# Patient Record
Sex: Female | Born: 2000 | Race: Black or African American | Hispanic: No | Marital: Single
Health system: Midwestern US, Community
[De-identification: ages and names within clinical notes are randomized; demographics above are authoritative.]

## PROBLEM LIST (undated history)

## (undated) DIAGNOSIS — N946 Dysmenorrhea, unspecified: Secondary | ICD-10-CM

## (undated) DIAGNOSIS — F331 Major depressive disorder, recurrent, moderate: Secondary | ICD-10-CM

## (undated) DIAGNOSIS — A599 Trichomoniasis, unspecified: Secondary | ICD-10-CM

## (undated) DIAGNOSIS — N760 Acute vaginitis: Principal | ICD-10-CM

## (undated) DIAGNOSIS — B379 Candidiasis, unspecified: Secondary | ICD-10-CM

## (undated) DIAGNOSIS — B9689 Other specified bacterial agents as the cause of diseases classified elsewhere: Secondary | ICD-10-CM

## (undated) DIAGNOSIS — N3000 Acute cystitis without hematuria: Secondary | ICD-10-CM

## (undated) DIAGNOSIS — E669 Obesity, unspecified: Secondary | ICD-10-CM

## (undated) DIAGNOSIS — Z789 Other specified health status: Secondary | ICD-10-CM

## (undated) HISTORY — PX: NO PAST SURGERIES: SHX2092

## (undated) HISTORY — DX: Obesity, unspecified: E66.9

---
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
## 2019-02-28 ENCOUNTER — Other Ambulatory Visit: Payer: Self-pay

## 2019-02-28 ENCOUNTER — Encounter (HOSPITAL_COMMUNITY): Payer: Self-pay

## 2019-02-28 ENCOUNTER — Ambulatory Visit (HOSPITAL_COMMUNITY)
Admission: EM | Admit: 2019-02-28 | Discharge: 2019-02-28 | Disposition: A | Discharge status: Home / Self Care | Payer: Medicaid Other

## 2019-02-28 DIAGNOSIS — K0889 Other specified disorders of teeth and supporting structures: Secondary | ICD-10-CM

## 2019-02-28 MED ORDER — AMOXICILLIN 500 MG PO CAPS: 500.0000 mg | ORAL_CAPSULE | Freq: Three times a day (TID) | ORAL | 0 refills | Status: DC

## 2019-02-28 MED ORDER — IBUPROFEN 800 MG PO TABS: 800.0000 mg | ORAL_TABLET | Freq: Three times a day (TID) | ORAL | 0 refills | Status: DC | PRN

## 2019-02-28 NOTE — Discharge Instructions (Addendum)
Take the amoxicillin and ibuprofen as prescribed.    A dental resource guide is attached.  Please follow-up with your dentist as scheduled on 03/06/2019.  Return here or go to the emergency department if you develop increased pain, swelling, fever, chills, or other concerning symptoms.

## 2019-02-28 NOTE — ED Provider Notes (Signed)
[facility]    CSN: [id number] Arrival date & time: 02/28/19  1510      History   Chief Complaint Chief Complaint  Patient presents with  . Dental Pain    HPI Heather Doyle is a 18 y.o. female.   Presents with a 4-day history of left lower tooth pain.  She has a dental appointment scheduled for 03/06/2019.  She rates the pain 8/10 at its worst.  She states the pain is worse with cold air and improves when her mouth is closed.  She denies difficulty swallowing or breathing.  She denies fever or chills.  LMP: 02/18/2019.  The history is provided by the patient.    Past Medical History:  Diagnosis Date  . Medical history non-contributory   . Obesity     Patient Active Problem List   Diagnosis Date Noted  . Morbid obesity (HCC) 11/29/2012  . Acanthosis nigricans, acquired 11/29/2012  . Pallor 11/29/2012  . Goiter 11/29/2012  . Dyspepsia 11/29/2012  . GERD (gastroesophageal reflux disease) 11/29/2012  . Essential hypertension, benign 11/29/2012  . Pure hypercholesterolemia 11/29/2012    Past Surgical History:  Procedure Laterality Date  . NO PAST SURGERIES      OB History    Gravida  0   Para  0   Term  0   Preterm  0   AB  0   Living  0     SAB  0   TAB  0   Ectopic  0   Multiple  0   Live Births  0            Home Medications    Prior to Admission medications   Medication Sig Start Date End Date Taking? Authorizing Provider  amoxicillin (AMOXIL) 500 MG capsule Take 1 capsule (500 mg total) by mouth 3 (three) times daily. 02/28/19   Mickie Bailate, Kao Berkheimer H, NP  cyclobenzaprine (FLEXERIL) 10 MG tablet Take 1 tablet by mouth before bed as needed for muscle spasm. Warning: May cause drowsiness. 10/22/18   Mardella LaymanHagler, Brian, MD  ibuprofen (ADVIL) 800 MG tablet Take 1 tablet (800 mg total) by mouth every 8 (eight) hours as needed. 02/28/19   Mickie Bailate, Neeti Knudtson H, NP  naproxen (NAPROSYN) 500 MG tablet Take 1 tablet (500 mg total) by mouth 2 (two) times  daily. 10/22/18   Mardella LaymanHagler, Brian, MD    Family History Family History  Problem Relation Age of Onset  . Hypertension Mother   . Diabetes Maternal Grandmother     Social History Social History   Tobacco Use  . Smoking status: Passive Smoke Exposure - Never Smoker  . Smokeless tobacco: Never Used  . Tobacco comment: boyfriend smokes  Substance Use Topics  . Alcohol use: No  . Drug use: No     Allergies   Patient has no known allergies.   Review of Systems Review of Systems  Constitutional: Negative for chills and fever.  HENT: Positive for dental problem. Negative for ear pain, sore throat and trouble swallowing.   Eyes: Negative for pain and visual disturbance.  Respiratory: Negative for cough and shortness of breath.   Cardiovascular: Negative for chest pain and palpitations.  Gastrointestinal: Negative for abdominal pain and vomiting.  Genitourinary: Negative for dysuria and hematuria.  Musculoskeletal: Negative for arthralgias and back pain.  Skin: Negative for color change and rash.  Neurological: Negative for seizures and syncope.  All other systems reviewed and are negative.    Physical Exam Triage Vital  Signs ED Triage Vitals  Enc Vitals Group     BP      Pulse      Resp      Temp      Temp src      SpO2      Weight      Height      Head Circumference      Peak Flow      Pain Score      Pain Loc      Pain Edu?      Excl. in [place]?    No data found.  Updated Vital Signs BP 107/72 (BP Location: Right Arm)   Pulse 82   Temp 98.4 F (36.9 C) (Oral)   Resp 16   LMP 02/18/2019 (Approximate)   SpO2 99%   Visual Acuity Right Eye Distance:   Left Eye Distance:   Bilateral Distance:    Right Eye Near:   Left Eye Near:    Bilateral Near:     Physical Exam Vitals signs and nursing note reviewed.  Constitutional:      General: She is not in acute distress.    Appearance: She is well-developed.  HENT:     Head: Normocephalic and atraumatic.      Right Ear: Tympanic membrane normal.     Left Ear: Tympanic membrane normal.     Nose: Nose normal.     Mouth/Throat:     Mouth: Mucous membranes are moist.     Dentition: Dental caries present.     Pharynx: Oropharynx is clear.      Comments: Several dental caries in various teeth.  Eyes:     Conjunctiva/sclera: Conjunctivae normal.  Neck:     Musculoskeletal: Neck supple.  Cardiovascular:     Rate and Rhythm: Normal rate and regular rhythm.     Heart sounds: No murmur.  Pulmonary:     Effort: Pulmonary effort is normal. No respiratory distress.     Breath sounds: Normal breath sounds.  Abdominal:     Palpations: Abdomen is soft.     Tenderness: There is no abdominal tenderness. There is no guarding or rebound.  Skin:    General: Skin is warm and dry.  Neurological:     Mental Status: She is alert.      UC Treatments / Results  Labs (all labs ordered are listed, but only abnormal results are displayed) Labs Reviewed - No data to display  EKG   Radiology No results found.  Procedures Procedures (including critical care time)  Medications Ordered in UC Medications - No data to display  Initial Impression / Assessment and Plan / UC Course  I have reviewed the triage vital signs and the nursing notes.  Pertinent labs & imaging results that were available during my care of the patient were reviewed by me and considered in my medical decision making (see chart for details).    Tooth ache.  Treating with amoxicillin and ibuprofen.  Instructed patient to keep her appointment with her dentist as scheduled on 03/06/2019.  Dental resource guide provided.  Instructed her to return here or go to the ED if she has increased pain, swelling, fever, chills, or other concerns.  Patient agrees to plan of care.     Final Clinical Impressions(s) / UC Diagnoses   Final diagnoses:  Toothache     Discharge Instructions     Take the amoxicillin and ibuprofen as prescribed.     A dental resource  guide is attached.  Please follow-up with your dentist as scheduled on 03/06/2019.  Return here or go to the emergency department if you develop increased pain, swelling, fever, chills, or other concerning symptoms.        ED Prescriptions    Medication Sig Dispense Auth. Provider   ibuprofen (ADVIL) 800 MG tablet Take 1 tablet (800 mg total) by mouth every 8 (eight) hours as needed. 21 tablet Mickie Bail, NP   amoxicillin (AMOXIL) 500 MG capsule Take 1 capsule (500 mg total) by mouth 3 (three) times daily. 21 capsule Mickie Bail, NP     I have reviewed the PDMP during this encounter.   Mickie Bail, NP 02/28/19 1606

## 2019-02-28 NOTE — ED Triage Notes (Signed)
Pt presents to UC w/ c/o left lower dental pain x4 days. Pt reports she has a dental appt but wanted to be seen for "some medicine" until them. Pt reports having pain when she opens her mouth.

## 2019-06-25 ENCOUNTER — Encounter: Payer: Self-pay | Admitting: Emergency Medicine

## 2019-06-25 ENCOUNTER — Other Ambulatory Visit: Payer: Self-pay

## 2019-06-25 ENCOUNTER — Ambulatory Visit
Admission: EM | Admit: 2019-06-25 | Discharge: 2019-06-25 | Disposition: A | Discharge status: Home / Self Care | Payer: Medicaid Other | Attending: Emergency Medicine | Admitting: Emergency Medicine

## 2019-06-25 ENCOUNTER — Telehealth: Payer: Self-pay | Admitting: Emergency Medicine

## 2019-06-25 DIAGNOSIS — R1011 Right upper quadrant pain: Secondary | ICD-10-CM | POA: Diagnosis not present

## 2019-06-25 LAB — CBC WITH DIFFERENTIAL/PLATELET
Basophils Absolute: 0 10*3/uL (ref 0.0–0.2)
Basos: 0 %
EOS (ABSOLUTE): 0 10*3/uL (ref 0.0–0.4)
Eos: 1 %
Hematocrit: 39 % (ref 34.0–46.6)
Hemoglobin: 12.6 g/dL (ref 11.1–15.9)
Lymphocytes Absolute: 1.8 10*3/uL (ref 0.7–3.1)
Lymphs: 42 %
MCH: 27.4 pg (ref 26.6–33.0)
MCHC: 32.3 g/dL (ref 31.5–35.7)
MCV: 85 fL (ref 79–97)
Monocytes Absolute: 0.3 10*3/uL (ref 0.1–0.9)
Monocytes: 7 %
Neutrophils Absolute: 2.2 10*3/uL (ref 1.4–7.0)
Neutrophils: 50 %
Platelets: 241 10*3/uL (ref 150–450)
RBC: 4.6 x10E6/uL (ref 3.77–5.28)
RDW: 14.2 % (ref 11.7–15.4)
WBC: 4.4 10*3/uL (ref 3.4–10.8)

## 2019-06-25 LAB — HEPATIC FUNCTION PANEL
ALT: 11 IU/L (ref 0–32)
AST: 13 IU/L (ref 0–40)
Albumin: 4.1 g/dL (ref 3.9–5.0)
Alkaline Phosphatase: 48 IU/L (ref 39–117)
Bilirubin Total: 0.2 mg/dL (ref 0.0–1.2)
Bilirubin, Direct: <0.1 mg/dL (ref 0.00–0.40)
Total Protein: 6.2 g/dL (ref 6.0–8.5)

## 2019-06-25 LAB — LIPASE: Lipase: 23 U/L (ref 14–72)

## 2019-06-25 LAB — AMYLASE: Amylase: 55 U/L (ref 31–110)

## 2019-06-25 NOTE — Discharge Instructions (Addendum)
Labs pending: will call you with results & can do work note at that time if normal. If abnormal, will need to go to ER for further evaluation. Keep symptom log & follow up with PCP in 1-2 weeks. Go to ER for worsening pain, vomiting, fever, yellow color to eyes

## 2019-06-25 NOTE — ED Triage Notes (Signed)
Pt presents to [facility] for assessment of RUQ pain since eating at a [employer] Saturday.  States the pain has been worsening.  C/o diarrhea, increased pain with palpation, feelings of being hot and diaphoretic, and 1 episode of emesis on Sunday morning.

## 2019-06-25 NOTE — ED Provider Notes (Signed)
[facility]    CSN: [id number] Arrival date & time: 06/25/19  1119      History   Chief Complaint Chief Complaint  Patient presents with  . Abdominal Pain    HPI Nioka Thorington is a 19 y.o. female with history of obesity, hypertension, GERD presenting for quadrant pain that began Saturday evening.  States that she ate at a [employer] a few hours prior: Her mother and sister did as well, though got different entres and did not have symptoms.  Patient states that she had a generalized stomachache with sharp shooting pains developing Sunday night.  Had single episode of nonbiliary, nonbloody emesis Sunday morning that was not preceded by nausea.  Patient states her RUQ pain comes and goes without known exacerbating or alleviating factors.  Has not tried anything for this, though has been drinking more water to see if it would help.  Was able to eat a ham sandwich yesterday which made her abdominal pain worse.  She denies change in bowel or bladder habit, fever, myalgias, arthralgias.  No hematochezia or melena.  Denies history of abdominal surgery.   Past Medical History:  Diagnosis Date  . Medical history non-contributory   . Obesity     Patient Active Problem List   Diagnosis Date Noted  . Morbid obesity (HCC) 11/29/2012  . Acanthosis nigricans, acquired 11/29/2012  . Pallor 11/29/2012  . Goiter 11/29/2012  . Dyspepsia 11/29/2012  . GERD (gastroesophageal reflux disease) 11/29/2012  . Essential hypertension, benign 11/29/2012  . Pure hypercholesterolemia 11/29/2012    Past Surgical History:  Procedure Laterality Date  . NO PAST SURGERIES      OB History    Gravida  0   Para  0   Term  0   Preterm  0   AB  0   Living  0     SAB  0   TAB  0   Ectopic  0   Multiple  0   Live Births  0            Home Medications    Prior to Admission medications   Medication Sig Start Date End Date Taking? Authorizing Provider  ibuprofen  (ADVIL) 800 MG tablet Take 1 tablet (800 mg total) by mouth every 8 (eight) hours as needed. 02/28/19   Mickie Bail, NP    Family History Family History  Problem Relation Age of Onset  . Hypertension Mother   . Diabetes Maternal Grandmother     Social History Social History   Tobacco Use  . Smoking status: Passive Smoke Exposure - Never Smoker  . Smokeless tobacco: Never Used  . Tobacco comment: boyfriend smokes  Substance Use Topics  . Alcohol use: No  . Drug use: No     Allergies   Patient has no known allergies.   Review of Systems As per HPI   Physical Exam Triage Vital Signs ED Triage Vitals  Enc Vitals Group     BP      Pulse      Resp      Temp      Temp src      SpO2      Weight      Height      Head Circumference      Peak Flow      Pain Score      Pain Loc      Pain Edu?      Excl. in  [place]?    No data found.  Updated Vital Signs BP 135/81 (BP Location: Left Arm)   Pulse 84   Temp 97.8 F (36.6 C) (Temporal)   Resp 18   LMP 06/06/2019   SpO2 97%   Visual Acuity Right Eye Distance:   Left Eye Distance:   Bilateral Distance:    Right Eye Near:   Left Eye Near:    Bilateral Near:     Physical Exam Constitutional:      General: She is not in acute distress. HENT:     Head: Normocephalic and atraumatic.  Eyes:     General: No scleral icterus.    Pupils: Pupils are equal, round, and reactive to light.  Cardiovascular:     Rate and Rhythm: Normal rate.  Pulmonary:     Effort: Pulmonary effort is normal. No respiratory distress.     Breath sounds: No wheezing.  Abdominal:     General: Bowel sounds are normal.     Tenderness: There is abdominal tenderness in the right upper quadrant. There is no right CVA tenderness, left CVA tenderness, guarding or rebound. Positive signs include Murphy's sign. Negative signs include Rovsing's sign and McBurney's sign.  Skin:    Coloration: Skin is not jaundiced or pale.  Neurological:      Mental Status: She is alert and oriented to person, place, and time.      UC Treatments / Results  Labs (all labs ordered are listed, but only abnormal results are displayed) Labs Reviewed  CBC WITH DIFFERENTIAL/PLATELET   Narrative:    Performed at:  [facility] - [facility] [address]  [address], [place], [place]  [id number] Lab Director: Mila Homer MD, Phone:  [phone]  HEPATIC FUNCTION PANEL   Narrative:    Performed at:  [facility] [address]  [address], [place], [place]  [id number] Lab Director: Mila Homer MD, Phone:  [phone]  AMYLASE   Narrative:    Performed at:  [facility] - [facility] [address]  [address], [place], [place]  [id number] Lab Director: Mila Homer MD, Phone:  [phone]  LIPASE   Narrative:    Performed at:  27 - [facility] [address]  [address], [place], [place]  [id number] Lab Director: Mila Homer MD, Phone:  [phone]    EKG   Radiology No results found.  Procedures Procedures (including critical care time)  Medications Ordered in UC Medications - No data to display  Initial Impression / Assessment and Plan / UC Course  I have reviewed the triage vital signs and the nursing notes.  Pertinent labs & imaging results that were available during my care of the patient were reviewed by me and considered in my medical decision making (see chart for details).     Patient afebrile, nontoxic in office today.  Patient does have significant RUQ tenderness to palpation on exam with positive Murphy sign.  Patient does have her gallbladder: Denies history of gallstones or liver issues.  No history of pancreatitis, heavy alcohol use or hypertriglyceridemia.  Will get amylase, lipase, hepatic function panel, CBC to to help rule in/out acute cholecystitis, otitis.  Patient requesting work note: We will defer until lab results are back-if normal, will allow patient to return to  work tomorrow she is afebrile and having improvement in symptoms.  If significantly elevated WBC, alkaline phosphatase, ALT/AST, or amylase/lipase would recommend patient go to ER for further evaluation  given pain on exam.  ER return precautions discussed, patient verbalized understanding and is agreeable to plan. Final Clinical Impressions(s) / UC Diagnoses   Final diagnoses:  RUQ pain     Discharge Instructions     Labs pending: will call you with results & can do work note at that time if normal. If abnormal, will need to go to ER for further evaluation. Keep symptom log & follow up with PCP in 1-2 weeks. Go to ER for worsening pain, vomiting, fever, yellow color to eyes    ED Prescriptions    None     PDMP not reviewed this encounter.   Hall-Potvin, [place], [place] 06/25/19 1542

## 2019-06-25 NOTE — Telephone Encounter (Signed)
Called patient to inform her of no abnormal lab results, per aPP request.  Also updated patient on work note, which I will print and leave at front desk for her.  Discussed pain management, including Ibuprofen or Naproxen, and heat.  Patient verbalized understanding.

## 2019-07-27 ENCOUNTER — Encounter: Payer: Self-pay | Admitting: Emergency Medicine

## 2019-07-27 ENCOUNTER — Ambulatory Visit
Admission: EM | Admit: 2019-07-27 | Discharge: 2019-07-27 | Disposition: A | Discharge status: Home / Self Care | Payer: Medicaid Other | Attending: Emergency Medicine | Admitting: Emergency Medicine

## 2019-07-27 ENCOUNTER — Other Ambulatory Visit: Payer: Self-pay

## 2019-07-27 DIAGNOSIS — G8929 Other chronic pain: Secondary | ICD-10-CM

## 2019-07-27 DIAGNOSIS — R1011 Right upper quadrant pain: Secondary | ICD-10-CM

## 2019-07-27 DIAGNOSIS — M546 Pain in thoracic spine: Secondary | ICD-10-CM | POA: Diagnosis not present

## 2019-07-27 MED ORDER — MELOXICAM 7.5 MG PO TABS: 7.5000 mg | ORAL_TABLET | Freq: Every day | ORAL | 0 refills | Status: DC

## 2019-07-27 NOTE — ED Triage Notes (Addendum)
Pt presents to [facility] for a reoccurence of the same abdominal pain starting two days ago, RUQ.  Also c/o back pain when she moves certain ways, like a pulling sensation from the RUQ pain location.  Denies vomiting.  C/o looser than normal stools.  Pt states she cannot afford to follow up with specialist recommended at her last visit.

## 2019-07-27 NOTE — ED Provider Notes (Signed)
[facility]    CSN: [id number] Arrival date & time: 07/27/19  1342      History   Chief Complaint Chief Complaint  Patient presents with  . Abdominal Pain    HPI Heather Doyle is a 19 y.o. female with history of obesity presenting for recurrent RUQ pain.  Patient previously evaluated by me for this on 06/25/2019: Unremarkable work-up including CBC, CMP, amylase and lipase.  Patient endorsing resolution of symptoms a few days after initial UC visit.  States this time has been ongoing for 3 days.  Feels it is worse with certain movements and radiates around towards her back.  Denies rash, fever, chills, other areas of pain.  No nausea, vomiting.  Patient tried Aleve without significant relief of pain.  Patient states that she does have to stand at work a lot and does perform a lot of bending motions.  No upper or lower extremity numbness, weakness, change in bowel or bladder habit.   Past Medical History:  Diagnosis Date  . Medical history non-contributory   . Obesity     Patient Active Problem List   Diagnosis Date Noted  . Morbid obesity ([place]) 11/29/2012  . Acanthosis nigricans, acquired 11/29/2012  . Pallor 11/29/2012  . Goiter 11/29/2012  . Dyspepsia 11/29/2012  . GERD (gastroesophageal reflux disease) 11/29/2012  . Essential hypertension, benign 11/29/2012  . Pure hypercholesterolemia 11/29/2012    Past Surgical History:  Procedure Laterality Date  . NO PAST SURGERIES      OB History    Gravida  0   Para  0   Term  0   Preterm  0   AB  0   Living  0     SAB  0   TAB  0   Ectopic  0   Multiple  0   Live Births  0            Home Medications    Prior to Admission medications   Medication Sig Start Date End Date Taking? Authorizing Provider  meloxicam (MOBIC) 7.5 MG tablet Take 1 tablet (7.5 mg total) by mouth daily. 07/27/19   Hall-Potvin, [place], PA-C    Family History Family History  Problem Relation Age of Onset  .  Hypertension Mother   . Diabetes Maternal Grandmother     Social History Social History   Tobacco Use  . Smoking status: Passive Smoke Exposure - Never Smoker  . Smokeless tobacco: Never Used  . Tobacco comment: boyfriend smokes  Substance Use Topics  . Alcohol use: No  . Drug use: No     Allergies   Patient has no known allergies.   Review of Systems As per HPI   Physical Exam Triage Vital Signs ED Triage Vitals  Enc Vitals Group     BP 07/27/19 1348 126/84     Pulse Rate 07/27/19 1348 83     Resp 07/27/19 1348 18     Temp 07/27/19 1348 (!) 97.2 F (36.2 C)     Temp Source 07/27/19 1348 Temporal     SpO2 07/27/19 1348 96 %     Weight --      Height --      Head Circumference --      Peak Flow --      Pain Score 07/27/19 1350 6     Pain Loc --      Pain Edu? --      Excl. in [place]? --    No  data found.  Updated Vital Signs BP 126/84 (BP Location: Left Arm)   Pulse 83   Temp (!) 97.2 F (36.2 C) (Temporal)   Resp 18   SpO2 96%   Visual Acuity Right Eye Distance:   Left Eye Distance:   Bilateral Distance:    Right Eye Near:   Left Eye Near:    Bilateral Near:     Physical Exam Constitutional:      General: She is not in acute distress.    Appearance: She is well-developed. She is obese. She is not ill-appearing.  HENT:     Head: Normocephalic and atraumatic.  Eyes:     General: No scleral icterus.    Pupils: Pupils are equal, round, and reactive to light.  Cardiovascular:     Rate and Rhythm: Normal rate and regular rhythm.     Heart sounds: No murmur. No gallop.   Pulmonary:     Effort: Pulmonary effort is normal. No respiratory distress.     Breath sounds: No wheezing.  Abdominal:     General: Bowel sounds are normal. There is no distension or abdominal bruit.     Palpations: Abdomen is soft.     Tenderness: There is abdominal tenderness in the right upper quadrant. There is no right CVA tenderness, left CVA tenderness, guarding or  rebound. Negative signs include Murphy's sign.  Musculoskeletal:     Comments: Mild right lateral thoracic back pain without spasm, edema, crepitus.  No rash, bruising, redness, or warmth.  No spinous process or paraspinal tenderness.  Skin:    Capillary Refill: Capillary refill takes less than 2 seconds.     Coloration: Skin is not jaundiced or pale.     Findings: No rash.  Neurological:     General: No focal deficit present.     Mental Status: She is alert and oriented to person, place, and time.      UC Treatments / Results  Labs (all labs ordered are listed, but only abnormal results are displayed) Labs Reviewed - No data to display  EKG   Radiology No results found.  Procedures Procedures (including critical care time)  Medications Ordered in UC Medications - No data to display  Initial Impression / Assessment and Plan / UC Course  I have reviewed the triage vital signs and the nursing notes.  Pertinent labs & imaging results that were available during my care of the patient were reviewed by me and considered in my medical decision making (see chart for details).     Patient appears well in office today.  Reassuring physical exam with previous unremarkable work-up for RUQ pain.  Stressed importance of following up with PCP for further evaluation/management as needed.  Will treat as musculoskeletal as outlined below.  Work note provided at patient's request.  Return precautions discussed, patient verbalized understanding and is agreeable to plan. Final Clinical Impressions(s) / UC Diagnoses   Final diagnoses:  Chronic RUQ pain  Acute right-sided thoracic back pain     Discharge Instructions     Heat therapy (hot compress, warm wash red, hot showers, etc.) can help relax muscles and soothe muscle aches. Cold therapy (ice packs) can be used to help swelling both after injury and after prolonged use of areas of chronic pain/aches.  For pain: take mobic as  directed.  May add tylenol only if needed.    ED Prescriptions    Medication Sig Dispense Auth. Provider   meloxicam (MOBIC) 7.5 MG tablet Take 1  tablet (7.5 mg total) by mouth daily. 14 tablet Hall-Potvin, [place], PA-C     I have reviewed the PDMP during this encounter.   Hall-Potvin, [place], [place] 07/27/19 1512

## 2019-07-27 NOTE — Discharge Instructions (Addendum)
Heat therapy (hot compress, warm wash red, hot showers, etc.) can help relax muscles and soothe muscle aches. Cold therapy (ice packs) can be used to help swelling both after injury and after prolonged use of areas of chronic pain/aches.  For pain: take mobic as directed.  May add tylenol only if needed.

## 2019-09-10 ENCOUNTER — Ambulatory Visit
Admission: EM | Admit: 2019-09-10 | Discharge: 2019-09-10 | Disposition: A | Discharge status: Home / Self Care | Payer: Medicaid Other | Attending: Physician Assistant | Admitting: Physician Assistant

## 2019-09-10 ENCOUNTER — Other Ambulatory Visit: Payer: Self-pay

## 2019-09-10 ENCOUNTER — Encounter: Payer: Self-pay | Admitting: Emergency Medicine

## 2019-09-10 DIAGNOSIS — R102 Pelvic and perineal pain: Secondary | ICD-10-CM

## 2019-09-10 DIAGNOSIS — M546 Pain in thoracic spine: Secondary | ICD-10-CM

## 2019-09-10 DIAGNOSIS — K5900 Constipation, unspecified: Secondary | ICD-10-CM

## 2019-09-10 LAB — POCT URINE PREGNANCY: Preg Test, Ur: NEGATIVE

## 2019-09-10 MED ORDER — METHOCARBAMOL 500 MG PO TABS: 500.0000 mg | ORAL_TABLET | Freq: Two times a day (BID) | ORAL | 0 refills | Status: DC

## 2019-09-10 MED ORDER — POLYETHYLENE GLYCOL 3350 17 G PO PACK: 17.0000 g | PACK | Freq: Every day | ORAL | 0 refills | Status: DC

## 2019-09-10 NOTE — ED Triage Notes (Signed)
Pt here with lower abd pain x 3 days; pt requesting pregnancy test; pt sts LMP was end of March; pt denies wanting testing for STD

## 2019-09-10 NOTE — Discharge Instructions (Signed)
Urine pregnancy negative. Start miralax as directed. Keep hydrated, urine should be clear to pale yellow in color. Follow up with PCP/GYN for further evaluation needed. If significant worsening of abdominal pain, nausea/vomiting, fever, go to the ED for further evaluation.  Can take ibuprofen/tylenol for back pain. Robaxin as needed, this can make you drowsy, so do not take if you are going to drive, operate heavy machinery, or make important decisions. Ice/heat compresses as needed.

## 2019-09-10 NOTE — ED Provider Notes (Addendum)
[facility]    CSN: [id number] Arrival date & time: 09/10/19  1421      History   Chief Complaint Chief Complaint  Patient presents with  . Abdominal Pain    HPI Heather Doyle is a 19 y.o. female.   19 year old female comes soon for 3-day history of low abdominal pain.  States pain is constant, waxing and waning in intensity.  She feels that smoking black in [place] makes it worse.  Denies worsening with oral intake.  She has had morning nausea without vomiting.  Has had constipation with hard stools.  Denies fever, chills, body aches.  Denies urinary for symptoms such as frequency, dysuria, hematuria.  Noticed one episode of brown discharge yesterday, has not noticed further episodes.  Denies vaginal itching.  LMP 07/27/2019.  Sexually active with 1 female partner, no consistent condom use, no birth control use. No worries for STDs.      Past Medical History:  Diagnosis Date  . Medical history non-contributory   . Obesity     Patient Active Problem List   Diagnosis Date Noted  . Morbid obesity ([place]) 11/29/2012  . Acanthosis nigricans, acquired 11/29/2012  . Pallor 11/29/2012  . Goiter 11/29/2012  . Dyspepsia 11/29/2012  . GERD (gastroesophageal reflux disease) 11/29/2012  . Essential hypertension, benign 11/29/2012  . Pure hypercholesterolemia 11/29/2012    Past Surgical History:  Procedure Laterality Date  . NO PAST SURGERIES      OB History    Gravida  0   Para  0   Term  0   Preterm  0   AB  0   Living  0     SAB  0   TAB  0   Ectopic  0   Multiple  0   Live Births  0            Home Medications    Prior to Admission medications   Medication Sig Start Date End Date Taking? Authorizing Provider  meloxicam (MOBIC) 7.5 MG tablet Take 1 tablet (7.5 mg total) by mouth daily. Patient not taking: Reported on 09/10/2019 07/27/19   Hall-Potvin, [place], PA-C  methocarbamol (ROBAXIN) 500 MG tablet Take 1 tablet (500 mg total) by  mouth 2 (two) times daily. 09/10/19   Tasia Catchings, Omauri Boeve V, PA-C  polyethylene glycol (MIRALAX) 17 g packet Take 17 g by mouth daily. 09/10/19   Ok Edwards, PA-C    Family History Family History  Problem Relation Age of Onset  . Hypertension Mother   . Diabetes Maternal Grandmother     Social History Social History   Tobacco Use  . Smoking status: Passive Smoke Exposure - Never Smoker  . Smokeless tobacco: Never Used  . Tobacco comment: boyfriend smokes  Substance Use Topics  . Alcohol use: No  . Drug use: No     Allergies   Patient has no known allergies.   Review of Systems Review of Systems  Reason unable to perform ROS: See HPI as above.     Physical Exam Triage Vital Signs ED Triage Vitals [09/10/19 1429]  Enc Vitals Group     BP 129/81     Pulse Rate 78     Resp 18     Temp 98.9 F (37.2 C)     Temp Source Oral     SpO2 98 %     Weight      Height      Head Circumference  Peak Flow      Pain Score 5     Pain Loc      Pain Edu?      Excl. in [place]?    No data found.  Updated Vital Signs BP 129/81 (BP Location: Left Arm)   Pulse 78   Temp 98.9 F (37.2 C) (Oral)   Resp 18   SpO2 98%   Physical Exam Constitutional:      General: She is not in acute distress.    Appearance: She is well-developed. She is not ill-appearing, toxic-appearing or diaphoretic.  HENT:     Head: Normocephalic and atraumatic.  Eyes:     Conjunctiva/sclera: Conjunctivae normal.     Pupils: Pupils are equal, round, and reactive to light.  Cardiovascular:     Rate and Rhythm: Normal rate and regular rhythm.  Pulmonary:     Effort: Pulmonary effort is normal. No respiratory distress.     Comments: LCTAB Abdominal:     General: Bowel sounds are normal.     Palpations: Abdomen is soft.     Tenderness: There is abdominal tenderness in the suprapubic area and left lower quadrant. There is no right CVA tenderness, left CVA tenderness, guarding or rebound.  Genitourinary:     Comments: Patient defer Musculoskeletal:     Cervical back: Normal range of motion and neck supple.     Comments: Tenderness to palpation of right thoracic back  Skin:    General: Skin is warm and dry.  Neurological:     Mental Status: She is alert and oriented to person, place, and time.  Psychiatric:        Behavior: Behavior normal.        Judgment: Judgment normal.      UC Treatments / Results  Labs (all labs ordered are listed, but only abnormal results are displayed) Labs Reviewed  POCT URINE PREGNANCY - Normal    EKG   Radiology No results found.  Procedures Procedures (including critical care time)  Medications Ordered in UC Medications - No data to display  Initial Impression / Assessment and Plan / UC Course  I have reviewed the triage vital signs and the nursing notes.  Pertinent labs & imaging results that were available during my care of the patient were reviewed by me and considered in my medical decision making (see chart for details).    Abdomen with suprapubic and left lower quadrant pain that is mild, no guarding or rebound.  Urine pregnancy negative.  No nausea or vomiting.  Currently with constipation.  Will start MiraLAX for constipation.  Push fluids.  Right-sided back pain on exam, will provide Robaxin, patient can take ibuprofen/Tylenol for pain as well.  Continue to monitor symptoms, return precautions given.  Otherwise, patient referred to GYN for further evaluation if continues without cycles.   Final Clinical Impressions(s) / UC Diagnoses   Final diagnoses:  Suprapubic abdominal pain  Constipation, unspecified constipation type  Acute right-sided thoracic back pain   ED Prescriptions    Medication Sig Dispense Auth. Provider   polyethylene glycol (MIRALAX) 17 g packet Take 17 g by mouth daily. 14 each Zinnia Tindall V, PA-C   methocarbamol (ROBAXIN) 500 MG tablet Take 1 tablet (500 mg total) by mouth 2 (two) times daily. 20 tablet Belinda Fisher,  PA-C     PDMP not reviewed this encounter.   Belinda Fisher, PA-C 09/10/19 1510    Linward Headland V, PA-C 09/10/19 1510

## 2019-09-28 ENCOUNTER — Ambulatory Visit (HOSPITAL_COMMUNITY)
Admission: EM | Admit: 2019-09-28 | Discharge: 2019-09-28 | Disposition: A | Discharge status: Home / Self Care | Payer: Medicaid Other | Attending: Family Medicine | Admitting: Family Medicine

## 2019-09-28 ENCOUNTER — Encounter (HOSPITAL_COMMUNITY): Payer: Self-pay

## 2019-09-28 ENCOUNTER — Ambulatory Visit (INDEPENDENT_AMBULATORY_CARE_PROVIDER_SITE_OTHER): Payer: Medicaid Other

## 2019-09-28 ENCOUNTER — Other Ambulatory Visit (HOSPITAL_COMMUNITY): Payer: Self-pay | Admitting: Internal Medicine

## 2019-09-28 ENCOUNTER — Telehealth (HOSPITAL_COMMUNITY): Payer: Self-pay | Admitting: *Deleted

## 2019-09-28 DIAGNOSIS — M545 Low back pain, unspecified: Secondary | ICD-10-CM

## 2019-09-28 DIAGNOSIS — R1031 Right lower quadrant pain: Secondary | ICD-10-CM

## 2019-09-28 DIAGNOSIS — R102 Pelvic and perineal pain: Secondary | ICD-10-CM | POA: Diagnosis not present

## 2019-09-28 LAB — POCT URINALYSIS DIP (DEVICE)
Bilirubin Urine: NEGATIVE
Glucose, UA: NEGATIVE mg/dL
Hgb urine dipstick: NEGATIVE
Ketones, ur: NEGATIVE mg/dL
Nitrite: NEGATIVE
Protein, ur: NEGATIVE mg/dL
Specific Gravity, Urine: 1.025 (ref 1.005–1.030)
Urobilinogen, UA: 0.2 mg/dL (ref 0.0–1.0)
pH: 7 (ref 5.0–8.0)

## 2019-09-28 LAB — POC URINE PREG, ED: Preg Test, Ur: NEGATIVE

## 2019-09-28 MED ORDER — EYE WASH OPHTH SOLN
OPHTHALMIC | Status: AC
  Filled 2019-09-28: qty 118

## 2019-09-28 MED ORDER — FLUORESCEIN SODIUM 1 MG OP STRP
ORAL_STRIP | OPHTHALMIC | Status: AC
  Filled 2019-09-28: qty 5

## 2019-09-28 MED ORDER — TETRACAINE HCL 0.5 % OP SOLN
OPHTHALMIC | Status: AC
  Filled 2019-09-28: qty 4

## 2019-09-28 NOTE — Progress Notes (Signed)
Pt left  Before I could tell her to go to the hospital for her labs. I tried to call her but her Vm is full.

## 2019-09-28 NOTE — ED Triage Notes (Signed)
Pt present lower back and abdominal pain. Symptoms started two weeks ago. Pt states that she went to another urgent care and they informed her that she was constipated but since that visit she had several bowel movements.

## 2019-09-28 NOTE — ED Provider Notes (Addendum)
[facility]    CSN: [id number] Arrival date & time: 09/28/19  1605      History   Chief Complaint Chief Complaint  Patient presents with  . Abdominal Pain  . Back Pain    HPI Heather Doyle is a 19 y.o. female. who presents with unresolved suprapubic pain LMP 5/4 but was a few days late, the prior one 4/5 Had neg pregnancy test and STD test 2 weeks when she went to urgent care. Abdominal pain provoked with standing, wearing tight clothes, after done eating, when she bares down to have a BM.     Past Medical History:  Diagnosis Date  . Medical history non-contributory   . Obesity     Patient Active Problem List   Diagnosis Date Noted  . Morbid obesity ([facility]) 11/29/2012  . Acanthosis nigricans, acquired 11/29/2012  . Pallor 11/29/2012  . Goiter 11/29/2012  . Dyspepsia 11/29/2012  . GERD (gastroesophageal reflux disease) 11/29/2012  . Essential hypertension, benign 11/29/2012  . Pure hypercholesterolemia 11/29/2012    Past Surgical History:  Procedure Laterality Date  . NO PAST SURGERIES      OB History    Gravida  0   Para  0   Term  0   Preterm  0   AB  0   Living  0     SAB  0   TAB  0   Ectopic  0   Multiple  0   Live Births  0            Home Medications    Prior to Admission medications   Medication Sig Start Date End Date Taking? Authorizing Provider  methocarbamol (ROBAXIN) 500 MG tablet Take 1 tablet (500 mg total) by mouth 2 (two) times daily. 09/10/19   Cathie Hoops, Amy V, PA-C  polyethylene glycol (MIRALAX) 17 g packet Take 17 g by mouth daily. 09/10/19   Belinda Fisher, PA-C    Family History Family History  Problem Relation Age of Onset  . Hypertension Mother   . Diabetes Maternal Grandmother     Social History Social History   Tobacco Use  . Smoking status: Passive Smoke Exposure - Never Smoker  . Smokeless tobacco: Never Used  . Tobacco comment: boyfriend smokes  Substance Use Topics  . Alcohol use: No  .  Drug use: No     Allergies   Patient has no known allergies.   Review of Systems Review of Systems  Constitutional: Positive for appetite change. Negative for chills, diaphoresis, fatigue and fever.  HENT: Negative for congestion.   Respiratory: Negative for shortness of breath.   Cardiovascular: Negative for chest pain and leg swelling.  Gastrointestinal: Positive for abdominal pain, constipation and nausea. Negative for abdominal distention, anal bleeding, blood in stool, diarrhea, rectal pain and vomiting.  Genitourinary: Positive for pelvic pain. Negative for difficulty urinating, dysuria, enuresis, frequency, hematuria, urgency, vaginal bleeding, vaginal discharge and vaginal pain.  Musculoskeletal: Negative for gait problem and myalgias.  Skin: Negative for rash.  Hematological: Negative for adenopathy.   Physical Exam Triage Vital Signs ED Triage Vitals  Enc Vitals Group     BP 09/28/19 1713 111/75     Pulse Rate 09/28/19 1713 75     Resp 09/28/19 1713 18     Temp 09/28/19 1713 98.4 F (36.9 C)     Temp Source 09/28/19 1713 Oral     SpO2 09/28/19 1713 100 %     Weight --  Height --      Head Circumference --      Peak Flow --      Pain Score 09/28/19 1715 9     Pain Loc --      Pain Edu? --      Excl. in [place]? --    No data found.  Updated Vital Signs BP 111/75 (BP Location: Left Arm)   Pulse 75   Temp 98.4 F (36.9 C) (Oral)   Resp 18   LMP 09/15/2019   SpO2 100%   Visual Acuity Right Eye Distance:   Left Eye Distance:   Bilateral Distance:    Right Eye Near:   Left Eye Near:    Bilateral Near:     Physical Exam Vitals and nursing note reviewed.  Constitutional:      General: She is not in acute distress.    Appearance: She is obese. She is not toxic-appearing.  HENT:     Head: Atraumatic.  Eyes:     General: No scleral icterus.    Extraocular Movements: Extraocular movements intact.  Pulmonary:     Effort: Pulmonary effort is normal.    Abdominal:     General: Abdomen is protuberant. Bowel sounds are normal.     Palpations: Abdomen is soft. There is no hepatomegaly, splenomegaly, mass or pulsatile mass.     Tenderness: There is abdominal tenderness in the right upper quadrant and suprapubic area. There is no right CVA tenderness, left CVA tenderness, guarding or rebound. Negative signs include Murphy's sign, psoas sign and obturator sign.     Hernia: No hernia is present.  Genitourinary:    Uterus: Normal.      Comments: Bimanual exam done only and shows no tenderness or provocation of abdominal pain with uterine motion. Due to obesity, it was difficult to assess ovarian fullness  Skin:    General: Skin is warm and dry.  Neurological:     Mental Status: She is alert and oriented to person, place, and time.  Psychiatric:        Mood and Affect: Mood normal.        Behavior: Behavior normal.   BACK- no tenderness with palpation of her back or ROM UC Treatments / Results  Labs (all labs ordered are listed, but only abnormal results are displayed) Labs Reviewed  POCT URINALYSIS DIP (DEVICE) - Abnormal; Notable for the following components:      Result Value   Leukocytes,Ua TRACE (*)    All other components within normal limits  CBC WITH DIFFERENTIAL/PLATELET  HIV ANTIBODY (ROUTINE TESTING W REFLEX)  POC URINE PREG, ED  CERVICOVAGINAL ANCILLARY ONLY  CBC abd HIV was cancelled   EKG   Radiology DG Abd 1 View  Result Date: 09/28/2019 CLINICAL DATA:  Abdominal pain and right lower quadrant pain for 2 weeks EXAM: ABDOMEN - 1 VIEW COMPARISON:  None. FINDINGS: Scattered large and small bowel gas is noted. No abnormal mass or abnormal calcifications are seen. No bony abnormality is noted. IMPRESSION: No acute abnormality seen. Electronically Signed   By: Inez Catalina M.D.   On: 09/28/2019 18:34    Procedures Procedures (including critical care time)  Medications Ordered in UC Medications - No data to  display  Initial Impression / Assessment and Plan / UC Course  I have reviewed the triage vital signs and the nursing notes. CBC with diff ordered but we could not obtain any blood after 3 times of trying. She wants to come tomorrow.  Pertinent labs & imaging results that were available during my care of the patient were reviewed by me and considered in my medical decision making (see chart for details). See instructions. Was also told she may take Ibuprofen for pain if she needs to.  Final Clinical Impressions(s) / UC Diagnoses   Final diagnoses:  Right lower quadrant abdominal pain  Suprapubic pain, acute  Low back pain, unspecified back pain laterality, unspecified chronicity, unspecified whether sciatica present     Discharge Instructions     Go to ER if your pain gets worse Try Colace 100 mg twice a day for 5 days to help empty out your colon     ED Prescriptions    None     PDMP not reviewed this encounter.   Garey Ham, PA-C 09/28/19 1904    Garey Ham, PA-C 09/28/19 1911

## 2019-09-28 NOTE — ED Notes (Signed)
Unable to obtain blood from pt per multiple clinical staff members. Provider notified.  Plan to have lab order put in system so pt can go to main hospital lab and have blood drawn 09/29/19.  Pt verbalized understanding.

## 2019-09-28 NOTE — Discharge Instructions (Addendum)
Go to ER if your pain gets worse Try Colace 100 mg twice a day for 5 days to help empty out your colon

## 2019-09-29 ENCOUNTER — Emergency Department (HOSPITAL_COMMUNITY)
Admission: EM | Admit: 2019-09-29 | Discharge: 2019-09-29 | Disposition: A | Discharge status: Home / Self Care | Payer: Medicaid Other | Attending: Emergency Medicine | Admitting: Emergency Medicine

## 2019-09-29 ENCOUNTER — Other Ambulatory Visit: Payer: Self-pay

## 2019-09-29 ENCOUNTER — Emergency Department (HOSPITAL_COMMUNITY): Payer: Medicaid Other

## 2019-09-29 DIAGNOSIS — I10 Essential (primary) hypertension: Secondary | ICD-10-CM | POA: Insufficient documentation

## 2019-09-29 DIAGNOSIS — R1011 Right upper quadrant pain: Secondary | ICD-10-CM | POA: Diagnosis not present

## 2019-09-29 DIAGNOSIS — Z7722 Contact with and (suspected) exposure to environmental tobacco smoke (acute) (chronic): Secondary | ICD-10-CM | POA: Diagnosis not present

## 2019-09-29 DIAGNOSIS — R1031 Right lower quadrant pain: Secondary | ICD-10-CM | POA: Diagnosis not present

## 2019-09-29 DIAGNOSIS — R1030 Lower abdominal pain, unspecified: Secondary | ICD-10-CM | POA: Insufficient documentation

## 2019-09-29 LAB — COMPREHENSIVE METABOLIC PANEL
ALT: 15 U/L (ref 0–44)
AST: 17 U/L (ref 15–41)
Albumin: 3.9 g/dL (ref 3.5–5.0)
Alkaline Phosphatase: 48 U/L (ref 38–126)
Anion gap: 12 (ref 5–15)
BUN: 11 mg/dL (ref 6–20)
CO2: 23 mmol/L (ref 22–32)
Calcium: 9.4 mg/dL (ref 8.9–10.3)
Chloride: 103 mmol/L (ref 98–111)
Creatinine, Ser: 0.72 mg/dL (ref 0.44–1.00)
GFR calc Af Amer: >60 mL/min (ref >60)
GFR calc non Af Amer: >60 mL/min (ref >60)
Glucose, Bld: 79 mg/dL (ref 70–99)
Potassium: 3.9 mmol/L (ref 3.5–5.1)
Sodium: 138 mmol/L (ref 135–145)
Total Bilirubin: 0.7 mg/dL (ref 0.3–1.2)
Total Protein: 7.3 g/dL (ref 6.5–8.1)

## 2019-09-29 LAB — I-STAT BETA HCG BLOOD, ED (MC, WL, AP ONLY): I-stat hCG, quantitative: <5 m[IU]/mL (ref <5)

## 2019-09-29 LAB — CBC
HCT: 43.3 % (ref 36.0–46.0)
Hemoglobin: 13.4 g/dL (ref 12.0–15.0)
MCH: 27.2 pg (ref 26.0–34.0)
MCHC: 30.9 g/dL (ref 30.0–36.0)
MCV: 88 fL (ref 80.0–100.0)
Platelets: 249 10*3/uL (ref 150–400)
RBC: 4.92 MIL/uL (ref 3.87–5.11)
RDW: 13.1 % (ref 11.5–15.5)
WBC: 5.6 10*3/uL (ref 4.0–10.5)
nRBC: 0 % (ref 0.0–0.2)

## 2019-09-29 LAB — LIPASE, BLOOD: Lipase: 20 U/L (ref 11–51)

## 2019-09-29 MED ORDER — SODIUM CHLORIDE 0.9% FLUSH
3.0000 mL | Freq: Once | INTRAVENOUS | Status: AC
  Administered 2019-09-29: 3 mL via INTRAVENOUS

## 2019-09-29 MED ORDER — DOXYCYCLINE HYCLATE 100 MG PO CAPS
100.0000 mg | ORAL_CAPSULE | Freq: Two times a day (BID) | ORAL | 0 refills | Status: DC
Start: 2019-09-29 — End: 2019-12-02

## 2019-09-29 MED ORDER — IBUPROFEN 400 MG PO TABS
600.0000 mg | ORAL_TABLET | Freq: Once | ORAL | Status: AC
  Administered 2019-09-29: 600 mg via ORAL
  Filled 2019-09-29: qty 1

## 2019-09-29 MED ORDER — IOHEXOL 300 MG/ML  SOLN
100.0000 mL | Freq: Once | INTRAMUSCULAR | Status: AC | PRN
  Administered 2019-09-29: 100 mL via INTRAVENOUS

## 2019-09-29 NOTE — ED Provider Notes (Signed)
[facility] EMERGENCY DEPARTMENT Provider Note   CSN: [id number] Arrival date & time: 09/29/19  1816     History Chief Complaint  Patient presents with  . Abdominal Pain    Heather Doyle is a 19 y.o. female.  19 year old female with prior medical history detailed below presents for evaluation of 2 weeks of abdominal pain.  Patient reports that she has had vague lower abdominal discomfort for the last 2 weeks.  She denies fever.  She denies nausea or vomiting.  She has been seen at least twice over the last week at urgent cares.  She was diagnosed with possible constipation and treated for the same.  She does report having blood work obtained at the urgent care.  She reports pelvic exams and STD checks.  She denies worsening of her symptoms.  She did not take anything at home today for her pain.  She is requesting CT to make sure that there is "nothing else wrong with me."  The history is provided by the patient and medical records.  Abdominal Pain Pain location:  RUQ and RLQ Pain quality: aching   Pain radiates to:  Does not radiate Pain severity:  Mild Onset quality:  Gradual Duration:  2 weeks Timing:  Constant Progression:  Waxing and waning Chronicity:  New Relieved by:  Nothing Worsened by:  Nothing Ineffective treatments:  None tried      Past Medical History:  Diagnosis Date  . Medical history non-contributory   . Obesity     Patient Active Problem List   Diagnosis Date Noted  . Morbid obesity ([facility]) 11/29/2012  . Acanthosis nigricans, acquired 11/29/2012  . Pallor 11/29/2012  . Goiter 11/29/2012  . Dyspepsia 11/29/2012  . GERD (gastroesophageal reflux disease) 11/29/2012  . Essential hypertension, benign 11/29/2012  . Pure hypercholesterolemia 11/29/2012    Past Surgical History:  Procedure Laterality Date  . NO PAST SURGERIES       OB History    Gravida  0   Para  0   Term  0   Preterm  0   AB  0   Living  0     SAB  0   TAB  0   Ectopic  0   Multiple  0   Live Births  0           Family History  Problem Relation Age of Onset  . Hypertension Mother   . Diabetes Maternal Grandmother     Social History   Tobacco Use  . Smoking status: Passive Smoke Exposure - Never Smoker  . Smokeless tobacco: Never Used  . Tobacco comment: boyfriend smokes  Substance Use Topics  . Alcohol use: No  . Drug use: No    Home Medications Prior to Admission medications   Medication Sig Start Date End Date Taking? Authorizing Provider  doxycycline (VIBRAMYCIN) 100 MG capsule Take 1 capsule (100 mg total) by mouth 2 (two) times daily. 09/29/19   Wynetta Fines, MD  methocarbamol (ROBAXIN) 500 MG tablet Take 1 tablet (500 mg total) by mouth 2 (two) times daily. 09/10/19   Cathie Hoops, Amy V, PA-C  polyethylene glycol (MIRALAX) 17 g packet Take 17 g by mouth daily. 09/10/19   Belinda Fisher, PA-C    Allergies    Patient has no known allergies.  Review of Systems   Review of Systems  Gastrointestinal: Positive for abdominal pain.  All other systems reviewed and are negative.   Physical Exam Updated Vital  Signs BP 127/75   Pulse 78   Temp 98.1 F (36.7 C) (Oral)   Resp 20   LMP 09/15/2019   SpO2 99%   Physical Exam Vitals and nursing note reviewed.  Constitutional:      General: She is not in acute distress.    Appearance: She is well-developed.  HENT:     Head: Normocephalic and atraumatic.  Eyes:     Conjunctiva/sclera: Conjunctivae normal.     Pupils: Pupils are equal, round, and reactive to light.  Cardiovascular:     Rate and Rhythm: Normal rate and regular rhythm.     Heart sounds: Normal heart sounds.  Pulmonary:     Effort: Pulmonary effort is normal. No respiratory distress.     Breath sounds: Normal breath sounds.  Abdominal:     General: There is no distension.     Palpations: Abdomen is soft.     Tenderness: There is no abdominal tenderness.  Musculoskeletal:        General: No  deformity. Normal range of motion.     Cervical back: Normal range of motion and neck supple.  Skin:    General: Skin is warm and dry.  Neurological:     Mental Status: She is alert and oriented to person, place, and time.     ED Results / Procedures / Treatments   Labs (all labs ordered are listed, but only abnormal results are displayed) Labs Reviewed  LIPASE, BLOOD  COMPREHENSIVE METABOLIC PANEL  CBC  URINALYSIS, ROUTINE W REFLEX MICROSCOPIC  I-STAT BETA HCG BLOOD, ED ([facility], [facility], [facility] ONLY)    EKG None  Radiology DG Abd 1 View  Result Date: 09/28/2019 CLINICAL DATA:  Abdominal pain and right lower quadrant pain for 2 weeks EXAM: ABDOMEN - 1 VIEW COMPARISON:  None. FINDINGS: Scattered large and small bowel gas is noted. No abnormal mass or abnormal calcifications are seen. No bony abnormality is noted. IMPRESSION: No acute abnormality seen. Electronically Signed   By: Alcide Clever M.D.   On: 09/28/2019 18:34   CT Abdomen Pelvis W Contrast  Result Date: 09/29/2019 CLINICAL DATA:  Acute nonlocalized abdominal pain. Nausea and vomiting. EXAM: CT ABDOMEN AND PELVIS WITH CONTRAST TECHNIQUE: Multidetector CT imaging of the abdomen and pelvis was performed using the standard protocol following bolus administration of intravenous contrast. CONTRAST:  OMNIPAQUE IOHEXOL 300 MG/ML  SOLN COMPARISON:  Radiograph yesterday. FINDINGS: Lower chest: The lung bases are clear. Hepatobiliary: Focal fatty infiltration adjacent to the falciform ligament. Liver is otherwise unremarkable. Gallbladder physiologically distended, no calcified stone. No biliary dilatation. Pancreas: No ductal dilatation or inflammation. Spleen: Normal in size without focal abnormality. Adrenals/Urinary Tract: Normal adrenal glands. No hydronephrosis or perinephric edema. Homogeneous renal enhancement. Urinary bladder is physiologically distended without wall thickening. Stomach/Bowel: Stomach is unremarkable. There is no small  bowel obstruction or inflammatory change. Normal appendix, for example series 6, image 39. Fat stranding in the right lower quadrant does not appear centered on the colon, and there is no colonic wall thickening. Small volume of colonic stool. Vascular/Lymphatic: Normal caliber abdominal aorta. Portal vein is patent. There are a few prominent central mesenteric nodes that are not enlarged by size criteria. Reproductive: Nabothian cyst in the cervix, uterus is otherwise unremarkable. Ovaries are symmetric in size without focal abnormality. There is complex free fluid in the pelvis and right adnexa. Other: Mild nonspecific fat stranding involving the right lower quadrant omental fat, not centered on the colon or the appendix. There is  mild reticulation of the right adnexal fat, adjacent to the ovary. Small volume complex free fluid in the pelvis. No free air or focal abscess. Musculoskeletal: There are no acute or suspicious osseous abnormalities. IMPRESSION: Mild nonspecific fat stranding involving the right lower quadrant omental fat, not centered on the colon, bowel, or the appendix. There is also mild reticulation of the right adnexal fat adjacent to the ovary, with a small volume of complex free fluid in the pelvis. Findings may be due to recent cyst rupture, however pelvic inflammatory disease is also considered. Possibility of omental infarct is raised, but felt less likely. No other acute findings or explanation for pain. Electronically Signed   By: Keith Rake M.D.   On: 09/29/2019 21:04    Procedures Procedures (including critical care time)  Medications Ordered in ED Medications  ibuprofen (ADVIL) tablet 600 mg (has no administration in time range)  sodium chloride flush (NS) 0.9 % injection 3 mL (3 mLs Intravenous Given 09/29/19 2010)  iohexol (OMNIPAQUE) 300 MG/ML solution 100 mL (100 mLs Intravenous Contrast Given 09/29/19 2042)    ED Course  I have reviewed the triage vital signs and  the nursing notes.  Pertinent labs & imaging results that were available during my care of the patient were reviewed by me and considered in my medical decision making (see chart for details).    MDM Rules/Calculators/A&P                      MDM  Screen complete  Heather Doyle was evaluated in Emergency Department on 09/29/2019 for the symptoms described in the history of present illness. She was evaluated in the context of the global COVID-19 pandemic, which necessitated consideration that the patient might be at risk for infection with the SARS-CoV-2 virus that causes COVID-19. Institutional protocols and algorithms that pertain to the evaluation of patients at risk for COVID-19 are in a state of rapid change based on information released by regulatory bodies including the CDC and federal and state organizations. These policies and algorithms were followed during the patient's care in the ED.  Patient is presenting for evaluation of 2 weeks of vague abdominal discomfort.    Patient reports that she has had several prior urgent care evaluations without significant findings.  Patient's work-up tonight does not show significant abnormality in the labs or CT.  Patient does report prior pelvic exam earlier this week with STD check.  Patient was offered repeat pelvic exam.  She declines.  Patient was offered a short course of antibiotics to treat possible pelvic infection.  She is agreeable to this plan.  Patient is otherwise advised to continue drinking plenty of fluids and using nonsteroidals for pain.  Patient appears to be appropriate for discharge.  Strict return precautions given.   Final Clinical Impression(s) / ED Diagnoses Final diagnoses:  Lower abdominal pain    Rx / DC Orders ED Discharge Orders         Ordered    doxycycline (VIBRAMYCIN) 100 MG capsule  2 times daily     09/29/19 2232           Valarie Merino, MD 09/29/19 2307

## 2019-09-29 NOTE — ED Triage Notes (Signed)
Pt c/o abdominal pain, nausea and vomiting x 2 weeks.

## 2019-09-29 NOTE — Discharge Instructions (Signed)
Please return for any problem.  Follow-up with your regular care provider as instructed. °

## 2019-09-30 ENCOUNTER — Telehealth (HOSPITAL_COMMUNITY): Payer: Self-pay

## 2019-09-30 MED ORDER — FLUCONAZOLE 150 MG PO TABS
150.0000 mg | ORAL_TABLET | Freq: Every day | ORAL | 0 refills | Status: AC
Start: 2019-09-30 — End: 2019-10-02

## 2019-09-30 MED ORDER — AZITHROMYCIN 250 MG PO TABS
1000.0000 mg | ORAL_TABLET | Freq: Once | ORAL | 0 refills | Status: AC
Start: 2019-09-30 — End: 2019-09-30

## 2019-09-30 MED ORDER — METRONIDAZOLE 500 MG PO TABS
500.0000 mg | ORAL_TABLET | Freq: Two times a day (BID) | ORAL | 0 refills | Status: DC
Start: 2019-09-30 — End: 2019-12-02

## 2019-10-17 LAB — CERVICOVAGINAL ANCILLARY ONLY
Bacterial Vaginitis (gardnerella): POSITIVE — AB
Candida Glabrata: NEGATIVE
Candida Vaginitis: POSITIVE — AB
Chlamydia: POSITIVE — AB
Comment: NEGATIVE
Comment: NEGATIVE
Comment: NEGATIVE
Comment: NEGATIVE
Comment: NEGATIVE
Comment: NORMAL
Neisseria Gonorrhea: NEGATIVE
Trichomonas: NEGATIVE

## 2019-12-02 ENCOUNTER — Other Ambulatory Visit: Payer: Self-pay

## 2019-12-02 ENCOUNTER — Encounter: Payer: Self-pay | Admitting: Physician Assistant

## 2019-12-02 ENCOUNTER — Ambulatory Visit
Admission: EM | Admit: 2019-12-02 | Discharge: 2019-12-02 | Disposition: A | Discharge status: Home / Self Care | Payer: Medicaid Other | Attending: Physician Assistant | Admitting: Physician Assistant

## 2019-12-02 DIAGNOSIS — R0981 Nasal congestion: Secondary | ICD-10-CM

## 2019-12-02 DIAGNOSIS — R05 Cough: Secondary | ICD-10-CM | POA: Diagnosis not present

## 2019-12-02 DIAGNOSIS — J3489 Other specified disorders of nose and nasal sinuses: Secondary | ICD-10-CM

## 2019-12-02 DIAGNOSIS — R059 Cough, unspecified: Secondary | ICD-10-CM

## 2019-12-02 MED ORDER — AMOXICILLIN-POT CLAVULANATE 875-125 MG PO TABS
1.0000 | ORAL_TABLET | Freq: Two times a day (BID) | ORAL | 0 refills | Status: DC
Start: 2019-12-02 — End: 2020-01-30

## 2019-12-02 MED ORDER — FLUTICASONE PROPIONATE 50 MCG/ACT NA SUSP: 2.0000 | Freq: Every day | NASAL | 0 refills | Status: AC

## 2019-12-02 MED ORDER — IPRATROPIUM BROMIDE 0.06 % NA SOLN: 2.0000 | Freq: Four times a day (QID) | NASAL | 0 refills | Status: AC

## 2019-12-02 NOTE — Discharge Instructions (Signed)
COVID PCR testing ordered. I would like you to quarantine until testing results. Augmentin for ear infection, this will cover for sinus infection as well. Could still be virus causing symptoms. Start flonase, atrovent nasal spray for nasal congestion/drainage. You can use over the counter nasal saline rinse such as neti pot for nasal congestion. Keep hydrated, your urine should be clear to pale yellow in color. Tylenol/motrin for fever and pain. Monitor for any worsening of symptoms, chest pain, shortness of breath, wheezing, swelling of the throat, go to the emergency department for further evaluation needed.

## 2019-12-02 NOTE — ED Triage Notes (Signed)
Provider assessment prior to RN triage, please see provider note.   

## 2019-12-02 NOTE — ED Provider Notes (Signed)
[facility]    CSN: [id number] Arrival date & time: 12/02/19  1316      History   Chief Complaint Chief Complaint  Patient presents with   URI    HPI Heather Doyle is a 19 y.o. female.   19 year old female comes in for 1 week history of URI symptoms. Sneezing, coughing, rhinorrhea, nasal congestion. Denies fever, chills, body aches. Denies abdominal pain, nausea, vomiting, diarrhea. Denies shortness of breath, loss of taste/smell. No COVID testing. No COVID vaccine. otc cold medicine without relief.      Past Medical History:  Diagnosis Date   Medical history non-contributory    Obesity     Patient Active Problem List   Diagnosis Date Noted   Morbid obesity (HCC) 11/29/2012   Acanthosis nigricans, acquired 11/29/2012   Pallor 11/29/2012   Goiter 11/29/2012   Dyspepsia 11/29/2012   GERD (gastroesophageal reflux disease) 11/29/2012   Essential hypertension, benign 11/29/2012   Pure hypercholesterolemia 11/29/2012    Past Surgical History:  Procedure Laterality Date   NO PAST SURGERIES      OB History    Gravida  0   Para  0   Term  0   Preterm  0   AB  0   Living  0     SAB  0   TAB  0   Ectopic  0   Multiple  0   Live Births  0            Home Medications    Prior to Admission medications   Medication Sig Start Date End Date Taking? Authorizing Provider  amoxicillin-clavulanate (AUGMENTIN) 875-125 MG tablet Take 1 tablet by mouth every 12 (twelve) hours. 12/02/19   Cathie Hoops, Refugio Vandevoorde V, PA-C  fluticasone (FLONASE) 50 MCG/ACT nasal spray Place 2 sprays into both nostrils daily. 12/02/19   Cathie Hoops, Teosha Casso V, PA-C  ipratropium (ATROVENT) 0.06 % nasal spray Place 2 sprays into both nostrils 4 (four) times daily. 12/02/19   Belinda Fisher, PA-C    Family History Family History  Problem Relation Age of Onset   Hypertension Mother    Diabetes Maternal Grandmother     Social History Social History   Tobacco Use   Smoking  status: Passive Smoke Exposure - Never Smoker   Smokeless tobacco: Never Used   Tobacco comment: boyfriend smokes  Vaping Use   Vaping Use: Never used  Substance Use Topics   Alcohol use: No   Drug use: No     Allergies   Patient has no known allergies.   Review of Systems Review of Systems  Reason unable to perform ROS: See HPI as above.     Physical Exam Triage Vital Signs ED Triage Vitals [12/02/19 1329]  Enc Vitals Group     BP 120/75     Pulse Rate 83     Resp 18     Temp 99 F (37.2 C)     Temp Source Oral     SpO2 97 %     Weight      Height      Head Circumference      Peak Flow      Pain Score      Pain Loc      Pain Edu?      Excl. in GC?    No data found.  Updated Vital Signs BP 120/75 (BP Location: Left Arm)    Pulse 83    Temp 99  F (37.2 C) (Oral)    Resp 18    SpO2 97%   Physical Exam Constitutional:      General: She is not in acute distress.    Appearance: Normal appearance. She is well-developed. She is not ill-appearing, toxic-appearing or diaphoretic.  HENT:     Head: Normocephalic and atraumatic.     Right Ear: Ear canal and external ear normal. Tympanic membrane is erythematous and bulging.     Left Ear: Ear canal and external ear normal. Tympanic membrane is erythematous and bulging.     Nose: Congestion present.     Right Sinus: Maxillary sinus tenderness and frontal sinus tenderness present.     Left Sinus: Maxillary sinus tenderness and frontal sinus tenderness present.     Mouth/Throat:     Mouth: Mucous membranes are moist.     Pharynx: Oropharynx is clear. Uvula midline.  Eyes:     Conjunctiva/sclera: Conjunctivae normal.     Pupils: Pupils are equal, round, and reactive to light.  Cardiovascular:     Rate and Rhythm: Normal rate and regular rhythm.  Pulmonary:     Effort: Pulmonary effort is normal. No accessory muscle usage, prolonged expiration, respiratory distress or retractions.     Breath sounds: No decreased  air movement or transmitted upper airway sounds. No decreased breath sounds.     Comments: LCTAB Musculoskeletal:     Cervical back: Normal range of motion and neck supple.  Skin:    General: Skin is warm and dry.  Neurological:     Mental Status: She is alert and oriented to person, place, and time.      UC Treatments / Results  Labs (all labs ordered are listed, but only abnormal results are displayed) Labs Reviewed  NOVEL CORONAVIRUS, NAA    EKG   Radiology No results found.  Procedures Procedures (including critical care time)  Medications Ordered in UC Medications - No data to display  Initial Impression / Assessment and Plan / UC Course  I have reviewed the triage vital signs and the nursing notes.  Pertinent labs & imaging results that were available during my care of the patient were reviewed by me and considered in my medical decision making (see chart for details).    COVID PCR test ordered. Patient to quarantine until testing results return. No alarming signs on exam.  LCTAB. Bilateral TM erythematous with bulging, given also with ear pain, will cover for otitis media with augmentin. Discussed could still be viral in nature, symptomatic treatment discussed.  Push fluids.  Return precautions given.  Patient expresses understanding and agrees to plan.  Final Clinical Impressions(s) / UC Diagnoses   Final diagnoses:  Cough  Nasal congestion  Sinus pressure    ED Prescriptions    Medication Sig Dispense Auth. Provider   amoxicillin-clavulanate (AUGMENTIN) 875-125 MG tablet Take 1 tablet by mouth every 12 (twelve) hours. 14 tablet Janyce Ellinger V, PA-C   ipratropium (ATROVENT) 0.06 % nasal spray Place 2 sprays into both nostrils 4 (four) times daily. 15 mL Najat Olazabal V, PA-C   fluticasone (FLONASE) 50 MCG/ACT nasal spray Place 2 sprays into both nostrils daily. 1 g Belinda Fisher, PA-C     PDMP not reviewed this encounter.   Belinda Fisher, PA-C 12/02/19 1349

## 2019-12-03 LAB — NOVEL CORONAVIRUS, NAA: SARS-CoV-2, NAA: NOT DETECTED

## 2019-12-03 LAB — SARS-COV-2, NAA 2 DAY TAT

## 2020-01-30 ENCOUNTER — Ambulatory Visit
Admission: EM | Admit: 2020-01-30 | Discharge: 2020-01-30 | Disposition: A | Discharge status: Home / Self Care | Payer: Medicaid Other | Attending: Emergency Medicine | Admitting: Emergency Medicine

## 2020-01-30 ENCOUNTER — Other Ambulatory Visit: Payer: Self-pay

## 2020-01-30 DIAGNOSIS — Z20822 Contact with and (suspected) exposure to covid-19: Secondary | ICD-10-CM | POA: Diagnosis not present

## 2020-01-30 DIAGNOSIS — R519 Headache, unspecified: Secondary | ICD-10-CM | POA: Diagnosis not present

## 2020-01-30 DIAGNOSIS — Z1152 Encounter for screening for COVID-19: Secondary | ICD-10-CM

## 2020-01-30 NOTE — ED Triage Notes (Signed)
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 Pt would like to be tested. Pt is aox4 and ambulatory.

## 2020-01-30 NOTE — Discharge Instructions (Addendum)
Your COVID test is pending - it is important to quarantine / isolate at home until your results are back. °If you test positive and would like further evaluation for persistent or worsening symptoms, you may schedule an E-visit or virtual (video) visit throughout the [facility] MyChart app or website. ° °PLEASE NOTE: If you develop severe chest pain or shortness of breath please go to the ER or call 9-1-1 for further evaluation --> DO NOT schedule electronic or virtual visits for this. °Please call our office for further guidance / recommendations as needed. ° °For information about the Covid vaccine, please visit [email or web address] °

## 2020-01-30 NOTE — ED Provider Notes (Signed)
[facility]    CSN: [id number] Arrival date & time: 01/30/20  1612      History   Chief Complaint Chief Complaint  Patient presents with  . Headache    x 2 days  . Sore Throat    x 2 days    HPI Heather Doyle is a 19 y.o. female  Presenting for Covid testing.  Endorsing 2-day course of generalized, throbbing headache, mild sore throat.  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  Denies fever, nasal congestion, sinus pressure, cough, difficulty breathing, chest pain.  No vomiting, diarrhea.  Has taken aleve w/ some relief.  Past Medical History:  Diagnosis Date  . Medical history non-contributory   . Obesity     Patient Active Problem List   Diagnosis Date Noted  . Morbid obesity (HCC) 11/29/2012  . Acanthosis nigricans, acquired 11/29/2012  . Pallor 11/29/2012  . Goiter 11/29/2012  . Dyspepsia 11/29/2012  . GERD (gastroesophageal reflux disease) 11/29/2012  . Essential hypertension, benign 11/29/2012  . Pure hypercholesterolemia 11/29/2012    Past Surgical History:  Procedure Laterality Date  . NO PAST SURGERIES      OB History    Gravida  0   Para  0   Term  0   Preterm  0   AB  0   Living  0     SAB  0   TAB  0   Ectopic  0   Multiple  0   Live Births  0            Home Medications    Prior to Admission medications   Medication Sig Start Date End Date Taking? Authorizing Provider  fluticasone (FLONASE) 50 MCG/ACT nasal spray Place 2 sprays into both nostrils daily. 12/02/19   Cathie Hoops, Amy V, PA-C  ipratropium (ATROVENT) 0.06 % nasal spray Place 2 sprays into both nostrils 4 (four) times daily. 12/02/19   Belinda Fisher, PA-C    Family History Family History  Problem Relation Age of Onset  . Hypertension Mother   . Diabetes Maternal Grandmother     Social History Social History   Tobacco Use  . Smoking status: Passive  Smoke Exposure - Never Smoker  . Smokeless tobacco: Never Used  . Tobacco comment: boyfriend smokes  Vaping Use  . Vaping Use: Never used  Substance Use Topics  . Alcohol use: No  . Drug use: No     Allergies   Patient has no known allergies.   Review of Systems As per HPI   Physical Exam Triage Vital Signs ED Triage Vitals  Enc Vitals Group     BP      Pulse      Resp      Temp      Temp src      SpO2      Weight      Height      Head Circumference      Peak Flow      Pain Score      Pain Loc      Pain Edu?      Excl. in GC?    No data found.  Updated Vital Signs BP 129/80 (BP Location: Left Arm)   Pulse 76   Temp 98.5 F (36.9 C) (Oral)   Resp 18   SpO2 96%   Visual Acuity  Right Eye Distance:   Left Eye Distance:   Bilateral Distance:    Right Eye Near:   Left Eye Near:    Bilateral Near:     Physical Exam Constitutional:      General: She is not in acute distress.    Appearance: She is obese. She is not ill-appearing or diaphoretic.  HENT:     Head: Normocephalic and atraumatic.     Mouth/Throat:     Mouth: Mucous membranes are moist.     Pharynx: Oropharynx is clear. No oropharyngeal exudate or posterior oropharyngeal erythema.  Eyes:     General: No scleral icterus.    Conjunctiva/sclera: Conjunctivae normal.     Pupils: Pupils are equal, round, and reactive to light.  Neck:     Comments: Trachea midline, negative JVD Cardiovascular:     Rate and Rhythm: Normal rate and regular rhythm.     Heart sounds: No murmur heard.  No gallop.   Pulmonary:     Effort: Pulmonary effort is normal. No respiratory distress.     Breath sounds: No wheezing, rhonchi or rales.  Musculoskeletal:     Cervical back: Neck supple. No tenderness.  Lymphadenopathy:     Cervical: No cervical adenopathy.  Skin:    Capillary Refill: Capillary refill takes less than 2 seconds.     Coloration: Skin is not jaundiced or pale.     Findings: No rash.   Neurological:     General: No focal deficit present.     Mental Status: She is alert and oriented to person, place, and time.      UC Treatments / Results  Labs (all labs ordered are listed, but only abnormal results are displayed) Labs Reviewed  NOVEL CORONAVIRUS, NAA    EKG   Radiology No results found.  Procedures Procedures (including critical care time)  Medications Ordered in UC Medications - No data to display  Initial Impression / Assessment and Plan / UC Course  I have reviewed the triage vital signs and the nursing notes.  Pertinent labs & imaging results that were available during my care of the patient were reviewed by me and considered in my medical decision making (see chart for details).     Patient afebrile, nontoxic, with SpO2 96%.  No neurocognitive deficits in office.  Patient not currently breast-feeding or pregnant: Offered headache cocktail-patient declined.  Covid PCR pending.  Patient to quarantine until results are back.  We will treat supportively as outlined below.  Return precautions discussed, patient verbalized understanding and is agreeable to plan. Final Clinical Impressions(s) / UC Diagnoses   Final diagnoses:  Acute nonintractable headache, unspecified headache type  Exposure to COVID-19 virus  Encounter for screening for COVID-19     Discharge Instructions     Your COVID test is pending - it is important to quarantine / isolate at home until your results are back. If you test positive and would like further evaluation for persistent or worsening symptoms, you may schedule an E-visit or virtual (video) visit throughout the [facility] app or website.  PLEASE NOTE: If you develop severe chest pain or shortness of breath please go to the ER or call 9-1-1 for further evaluation --> DO NOT schedule electronic or virtual visits for this. Please call our office for further guidance / recommendations as needed.  For information  about the Covid vaccine, please visit [email or web address]    ED Prescriptions    None     PDMP not reviewed  this encounter.   [place], [place], [place] 01/30/20 1653

## 2020-02-01 LAB — NOVEL CORONAVIRUS, NAA: SARS-CoV-2, NAA: DETECTED — AB

## 2020-02-01 LAB — SARS-COV-2, NAA 2 DAY TAT

## 2020-02-04 ENCOUNTER — Encounter: Payer: Self-pay | Admitting: Hospice and Palliative Medicine

## 2020-02-04 ENCOUNTER — Telehealth: Payer: Self-pay | Admitting: Hospice and Palliative Medicine

## 2020-02-04 ENCOUNTER — Other Ambulatory Visit: Payer: Self-pay | Admitting: Hospice and Palliative Medicine

## 2020-02-04 DIAGNOSIS — U071 COVID-19: Secondary | ICD-10-CM

## 2020-02-04 NOTE — Telephone Encounter (Signed)
I called to discuss with patient about Covid-19 symptoms and the use of regeneron, a monoclonal antibody infusion for those with mild to moderate Covid-19 symptoms and at a high risk of hospitalization.     Pt is qualified for this infusion at the [facility] due to co-morbid conditions and/or a member of an at-risk group.     Unable to reach pt. Left message to return call  Olive Zmuda, PhD, NP-C [phone] (Infusion Center Hotline)  

## 2020-02-04 NOTE — Progress Notes (Signed)
I connected by phone with patient on 02/04/2020 to discuss the potential use of an new treatment for mild to moderate COVID-19 viral infection in non-hospitalized patients.   This patient is a age/sex that meets the FDA criteria for Emergency Use Authorization of casirivimab\imdevimab.  Has a (+) direct SARS-CoV-2 viral test result 1. Has mild or moderate COVID-19  2. Is ? 20 years of age and weighs ? 40 kg 3. Is NOT hospitalized due to COVID-19 4. Is NOT requiring oxygen therapy or requiring an increase in baseline oxygen flow rate due to COVID-19 5. Is within 10 days of symptom onset 6. Has at least one of the high risk factor(s) for progression to severe COVID-19 and/or hospitalization as defined in EUA. ? Specific high risk criteria : BMI   Symptom onset  9/16   I have spoken and communicated the following to the patient or parent/caregiver:   1. FDA has authorized the emergency use of casirivimab\imdevimab for the treatment of mild to moderate COVID-19 in adults and pediatric patients with positive results of direct SARS-CoV-2 viral testing who are 28 years of age and older weighing at least 40 kg, and who are at high risk for progressing to severe COVID-19 and/or hospitalization.   2. The significant known and potential risks and benefits of casirivimab\imdevimab, and the extent to which such potential risks and benefits are unknown.   3. Information on available alternative treatments and the risks and benefits of those alternatives, including clinical trials.   4. Patients treated with casirivimab\imdevimab should continue to self-isolate and use infection control measures (e.g., wear mask, isolate, social distance, avoid sharing personal items, clean and disinfect "high touch" surfaces, and frequent handwashing) according to CDC guidelines.    5. The patient or parent/caregiver has the option to accept or refuse casirivimab\imdevimab .   After reviewing this information with the  patient, The patient agreed to proceed with receiving casirivimab\imdevimab infusion and will be provided a copy of the Fact sheet prior to receiving the infusion.Laurette Schimke, PhD, NP-C [phone] (Infusion Center Hotline)

## 2020-02-05 ENCOUNTER — Ambulatory Visit (HOSPITAL_COMMUNITY): Payer: Medicaid Other

## 2020-02-06 ENCOUNTER — Ambulatory Visit (HOSPITAL_COMMUNITY)
Admission: RE | Admit: 2020-02-06 | Discharge: 2020-02-06 | Disposition: A | Discharge status: Home / Self Care | Payer: Medicaid Other | Source: Ambulatory Visit | Attending: Pulmonary Disease | Admitting: Pulmonary Disease

## 2020-02-06 DIAGNOSIS — U071 COVID-19: Secondary | ICD-10-CM

## 2020-02-06 MED ORDER — FAMOTIDINE IN NACL 20-0.9 MG/50ML-% IV SOLN: 20.0000 mg | Freq: Once | INTRAVENOUS | Status: DC | PRN

## 2020-02-06 MED ORDER — DIPHENHYDRAMINE HCL 50 MG/ML IJ SOLN: 50.0000 mg | Freq: Once | INTRAMUSCULAR | Status: DC | PRN

## 2020-02-06 MED ORDER — SODIUM CHLORIDE 0.9 % IV SOLN
1200.0000 mg | Freq: Once | INTRAVENOUS | Status: AC
  Administered 2020-02-06: 1200 mg via INTRAVENOUS

## 2020-02-06 MED ORDER — SODIUM CHLORIDE 0.9 % IV SOLN: INTRAVENOUS | Status: DC | PRN

## 2020-02-06 MED ORDER — METHYLPREDNISOLONE SODIUM SUCC 125 MG IJ SOLR: 125.0000 mg | Freq: Once | INTRAMUSCULAR | Status: DC | PRN

## 2020-02-06 MED ORDER — EPINEPHRINE 0.3 MG/0.3ML IJ SOAJ: 0.3000 mg | Freq: Once | INTRAMUSCULAR | Status: DC | PRN

## 2020-02-06 MED ORDER — ALBUTEROL SULFATE HFA 108 (90 BASE) MCG/ACT IN AERS: 2.0000 | INHALATION_SPRAY | Freq: Once | RESPIRATORY_TRACT | Status: DC | PRN

## 2020-02-06 NOTE — Progress Notes (Signed)
  Diagnosis: COVID-19  Physician: dr patrick wright  Procedure: Covid [facility]fusion [facility] Med: casirivimab\imdevimab infusion - Provided patient with casirivimab\imdevimab fact sheet for patients, parents and caregivers prior to infusion.  Complications: No immediate complications noted.  Discharge: Discharged home   Heather Doyle R 02/06/2020   

## 2020-02-06 NOTE — Discharge Instructions (Signed)

## 2020-05-12 ENCOUNTER — Ambulatory Visit: Payer: Self-pay

## 2020-05-13 ENCOUNTER — Ambulatory Visit: Admit: 2020-05-13 | Discharge status: Against Medical Advice | Payer: Medicaid Other

## 2020-05-14 ENCOUNTER — Other Ambulatory Visit: Payer: Self-pay

## 2020-05-14 ENCOUNTER — Ambulatory Visit
Admission: EM | Admit: 2020-05-14 | Discharge: 2020-05-14 | Disposition: A | Discharge status: Home / Self Care | Payer: Medicaid Other | Attending: Emergency Medicine | Admitting: Emergency Medicine

## 2020-05-14 DIAGNOSIS — B349 Viral infection, unspecified: Secondary | ICD-10-CM | POA: Insufficient documentation

## 2020-05-14 DIAGNOSIS — R197 Diarrhea, unspecified: Secondary | ICD-10-CM | POA: Insufficient documentation

## 2020-05-14 LAB — POCT URINE PREGNANCY: Preg Test, Ur: NEGATIVE

## 2020-05-14 MED ORDER — FLUCONAZOLE 150 MG PO TABS: 150.0000 mg | ORAL_TABLET | Freq: Once | ORAL | 0 refills | Status: AC

## 2020-05-14 MED ORDER — IBUPROFEN 800 MG PO TABS: 800.0000 mg | ORAL_TABLET | Freq: Three times a day (TID) | ORAL | 0 refills | Status: DC

## 2020-05-14 NOTE — Discharge Instructions (Addendum)
Covid test pending, monitor my chart for results Rest and fluids Tylenol and ibuprofen for body aches Drink plenty of water with diarrhea, may use over-the-counter Imodium or Pepto-Bismol to slow stools  1 tablet of Diflucan today, repeat in 72 hours if swab positive for yeast and still having symptoms  Please return for any concerns

## 2020-05-14 NOTE — ED Triage Notes (Signed)
Pt presents with loss of taste & smell and diarrhea X 4 days. Pt states 4 family members have tested positive for covid that she has had exposure to.  Pt also complains of abnormal vaginal discharge.

## 2020-05-14 NOTE — ED Provider Notes (Signed)
[facility]    CSN: [id number] Arrival date & time: 05/14/20  0805      History   Chief Complaint Chief Complaint  Patient presents with  . Diarrhea  . Loss of Taste & Smell  . Vaginitis    HPI Shanell Dymphna Wadley is a 19 y.o. female history of hypertension, GERD, presenting today for loss of taste and smell and diarrhea.  Patient had Covid on 01/2020 and received infusion. Reports he has had multiple family members that she has been around recently that have tested positive for Covid. She denies any URI symptoms of cough congestion or sore throat. She has had fatigue and body aches. Denies abdominal pain nausea or vomiting.  Also concerned for possible yeast infection. Reports thick white discharge with associated itching. Feels similar to prior yeast infections. Has irregular cycles, not on birth control.  HPI  Past Medical History:  Diagnosis Date  . Medical history non-contributory   . Obesity     Patient Active Problem List   Diagnosis Date Noted  . Morbid obesity (HCC) 11/29/2012  . Acanthosis nigricans, acquired 11/29/2012  . Pallor 11/29/2012  . Goiter 11/29/2012  . Dyspepsia 11/29/2012  . GERD (gastroesophageal reflux disease) 11/29/2012  . Essential hypertension, benign 11/29/2012  . Pure hypercholesterolemia 11/29/2012    Past Surgical History:  Procedure Laterality Date  . NO PAST SURGERIES      OB History    Gravida  0   Para  0   Term  0   Preterm  0   AB  0   Living  0     SAB  0   IAB  0   Ectopic  0   Multiple  0   Live Births  0            Home Medications    Prior to Admission medications   Medication Sig Start Date End Date Taking? Authorizing Provider  fluconazole (DIFLUCAN) 150 MG tablet Take 1 tablet (150 mg total) by mouth once for 1 dose. 05/14/20 05/14/20 Yes Vastie Douty C, PA-C  ibuprofen (ADVIL) 800 MG tablet Take 1 tablet (800 mg total) by mouth 3 (three) times daily. 05/14/20  Yes Julitza Rickles,  Lucca Ballo C, PA-C  fluticasone (FLONASE) 50 MCG/ACT nasal spray Place 2 sprays into both nostrils daily. 12/02/19   Cathie Hoops, Amy V, PA-C  ipratropium (ATROVENT) 0.06 % nasal spray Place 2 sprays into both nostrils 4 (four) times daily. 12/02/19   Belinda Fisher, PA-C    Family History Family History  Problem Relation Age of Onset  . Hypertension Mother   . Diabetes Maternal Grandmother     Social History Social History   Tobacco Use  . Smoking status: Passive Smoke Exposure - Never Smoker  . Smokeless tobacco: Never Used  . Tobacco comment: boyfriend smokes  Vaping Use  . Vaping Use: Never used  Substance Use Topics  . Alcohol use: No  . Drug use: No     Allergies   Patient has no known allergies.   Review of Systems Review of Systems  Constitutional: Positive for fatigue. Negative for activity change, appetite change, chills and fever.  HENT: Negative for congestion, ear pain, rhinorrhea, sinus pressure, sore throat and trouble swallowing.   Eyes: Negative for discharge and redness.  Respiratory: Negative for cough, chest tightness and shortness of breath.   Cardiovascular: Negative for chest pain.  Gastrointestinal: Positive for diarrhea. Negative for abdominal pain, nausea and vomiting.  Musculoskeletal: Positive  for myalgias.  Skin: Negative for rash.  Neurological: Negative for dizziness, light-headedness and headaches.     Physical Exam Triage Vital Signs ED Triage Vitals  Enc Vitals Group     BP      Pulse      Resp      Temp      Temp src      SpO2      Weight      Height      Head Circumference      Peak Flow      Pain Score      Pain Loc      Pain Edu?      Excl. in GC?    No data found.  Updated Vital Signs BP 117/72 (BP Location: Left Arm)   Pulse 83   Temp 98.1 F (36.7 C) (Oral)   Resp 18   LMP  (LMP Unknown)   SpO2 98%   Visual Acuity Right Eye Distance:   Left Eye Distance:   Bilateral Distance:    Right Eye Near:   Left Eye Near:     Bilateral Near:     Physical Exam Vitals and nursing note reviewed.  Constitutional:      Appearance: She is well-developed and well-nourished.     Comments: No acute distress  HENT:     Head: Normocephalic and atraumatic.     Ears:     Comments: Bilateral ears without tenderness to palpation of external auricle, tragus and mastoid, EAC's without erythema or swelling, TM's with good bony landmarks and cone of light. Non erythematous.     Nose: Nose normal.     Mouth/Throat:     Comments: Oral mucosa pink and moist, no tonsillar enlargement or exudate. Posterior pharynx patent and nonerythematous, no uvula deviation or swelling. Normal phonation. Eyes:     Conjunctiva/sclera: Conjunctivae normal.  Cardiovascular:     Rate and Rhythm: Normal rate.  Pulmonary:     Effort: Pulmonary effort is normal. No respiratory distress.     Comments: Breathing comfortably at rest, CTABL, no wheezing, rales or other adventitious sounds auscultated Abdominal:     General: There is no distension.  Musculoskeletal:        General: Normal range of motion.     Cervical back: Neck supple.  Skin:    General: Skin is warm and dry.  Neurological:     Mental Status: She is alert and oriented to person, place, and time.  Psychiatric:        Mood and Affect: Mood and affect normal.      UC Treatments / Results  Labs (all labs ordered are listed, but only abnormal results are displayed) Labs Reviewed  NOVEL CORONAVIRUS, NAA  POCT URINE PREGNANCY  CERVICOVAGINAL ANCILLARY ONLY    EKG   Radiology No results found.  Procedures Procedures (including critical care time)  Medications Ordered in UC Medications - No data to display  Initial Impression / Assessment and Plan / UC Course  I have reviewed the triage vital signs and the nursing notes.  Pertinent labs & imaging results that were available during my care of the patient were reviewed by me and considered in my medical decision  making (see chart for details).     1. Fatigue body aches and diarrhea-likely viral etiology, recommending symptomatic and supportive care. Covid test pending.  2. Vaginal discharge-pregnancy test negative, empirically treating for yeast. Vaginal swab pending for further confirmation.  Discussed strict  return precautions. Patient verbalized understanding and is agreeable with plan.   Final Clinical Impressions(s) / UC Diagnoses   Final diagnoses:  Viral illness  Diarrhea, unspecified type     Discharge Instructions     Covid test pending, monitor my chart for results Rest and fluids Tylenol and ibuprofen for body aches Drink plenty of water with diarrhea, may use over-the-counter Imodium or Pepto-Bismol to slow stools  1 tablet of Diflucan today, repeat in 72 hours if swab positive for yeast and still having symptoms  Please return for any concerns     ED Prescriptions    Medication Sig Dispense Auth. Provider   ibuprofen (ADVIL) 800 MG tablet Take 1 tablet (800 mg total) by mouth 3 (three) times daily. 21 tablet Tyner Codner C, PA-C   fluconazole (DIFLUCAN) 150 MG tablet Take 1 tablet (150 mg total) by mouth once for 1 dose. 2 tablet Georgeanne Frankland, [place] C, PA-C     PDMP not reviewed this encounter.   Lew Dawes, [place] 05/14/20 [phone]

## 2020-05-16 LAB — NOVEL CORONAVIRUS, NAA: SARS-CoV-2, NAA: NOT DETECTED

## 2020-05-16 LAB — SARS-COV-2, NAA 2 DAY TAT

## 2020-05-18 ENCOUNTER — Telehealth: Payer: Self-pay | Admitting: Emergency Medicine

## 2020-05-18 LAB — CERVICOVAGINAL ANCILLARY ONLY
Bacterial Vaginitis (gardnerella): POSITIVE — AB
Candida Glabrata: NEGATIVE
Candida Vaginitis: POSITIVE — AB
Chlamydia: NEGATIVE
Comment: NEGATIVE
Comment: NEGATIVE
Comment: NEGATIVE
Comment: NEGATIVE
Comment: NEGATIVE
Comment: NORMAL
Neisseria Gonorrhea: NEGATIVE
Trichomonas: NEGATIVE

## 2020-05-18 MED ORDER — FLUCONAZOLE 150 MG PO TABS: 150.0000 mg | ORAL_TABLET | Freq: Once | ORAL | 0 refills | Status: AC

## 2020-05-18 MED ORDER — IBUPROFEN 800 MG PO TABS: 800.0000 mg | ORAL_TABLET | Freq: Three times a day (TID) | ORAL | 0 refills | Status: DC

## 2020-05-18 NOTE — Telephone Encounter (Signed)
Changing pharmacy.

## 2020-05-20 ENCOUNTER — Other Ambulatory Visit: Payer: Self-pay

## 2020-05-20 ENCOUNTER — Encounter: Payer: Self-pay | Admitting: Emergency Medicine

## 2020-05-20 ENCOUNTER — Ambulatory Visit
Admission: EM | Admit: 2020-05-20 | Discharge: 2020-05-20 | Disposition: A | Discharge status: Home / Self Care | Payer: Medicaid Other | Attending: Internal Medicine | Admitting: Internal Medicine

## 2020-05-20 DIAGNOSIS — Z20822 Contact with and (suspected) exposure to covid-19: Secondary | ICD-10-CM

## 2020-05-20 DIAGNOSIS — R6889 Other general symptoms and signs: Secondary | ICD-10-CM

## 2020-05-20 MED ORDER — BENZONATATE 100 MG PO CAPS: 100.0000 mg | ORAL_CAPSULE | Freq: Three times a day (TID) | ORAL | 0 refills | Status: AC

## 2020-05-20 MED ORDER — IBUPROFEN 800 MG PO TABS: 800.0000 mg | ORAL_TABLET | Freq: Three times a day (TID) | ORAL | 0 refills | Status: AC

## 2020-05-20 MED ORDER — ACETAMINOPHEN 325 MG PO TABS
650.0000 mg | ORAL_TABLET | Freq: Once | ORAL | Status: AC
  Administered 2020-05-20: 650 mg via ORAL

## 2020-05-20 MED ORDER — BENZONATATE 100 MG PO CAPS: 100.0000 mg | ORAL_CAPSULE | Freq: Three times a day (TID) | ORAL | 0 refills | Status: DC

## 2020-05-20 NOTE — ED Triage Notes (Signed)
Pt sts cough, congestion with facial pain and fever starting yesterday; pt sts possible covid exposure

## 2020-05-20 NOTE — ED Provider Notes (Signed)
[facility]    CSN: [id number] Arrival date & time: 05/20/20  2992      History   Chief Complaint Chief Complaint  Patient presents with  . Cough  . Facial Pain  . Fever    HPI Heather Doyle is a 20 y.o. female comes to urgent care with 1 day history of nonproductive cough, nasal congestion and a fever.  Patient was exposed to a COVID-19 positive individual over the holidays.Patient said cough is nonproductive, severe and associated with some chest tightness.  She has generalized body aches   HPI  Past Medical History:  Diagnosis Date  . Medical history non-contributory   . Obesity     Patient Active Problem List   Diagnosis Date Noted  . Morbid obesity (HCC) 11/29/2012  . Acanthosis nigricans, acquired 11/29/2012  . Pallor 11/29/2012  . Goiter 11/29/2012  . Dyspepsia 11/29/2012  . GERD (gastroesophageal reflux disease) 11/29/2012  . Essential hypertension, benign 11/29/2012  . Pure hypercholesterolemia 11/29/2012    Past Surgical History:  Procedure Laterality Date  . NO PAST SURGERIES      OB History    Gravida  0   Para  0   Term  0   Preterm  0   AB  0   Living  0     SAB  0   IAB  0   Ectopic  0   Multiple  0   Live Births  0            Home Medications    Prior to Admission medications   Medication Sig Start Date End Date Taking? Authorizing Provider  benzonatate (TESSALON) 100 MG capsule Take 1 capsule (100 mg total) by mouth every 8 (eight) hours. 05/20/20   Merrilee Jansky, MD  fluticasone (FLONASE) 50 MCG/ACT nasal spray Place 2 sprays into both nostrils daily. 12/02/19   Cathie Hoops, Amy V, PA-C  ibuprofen (ADVIL) 800 MG tablet Take 1 tablet (800 mg total) by mouth 3 (three) times daily. 05/20/20   Merrilee Jansky, MD  ipratropium (ATROVENT) 0.06 % nasal spray Place 2 sprays into both nostrils 4 (four) times daily. 12/02/19   Belinda Fisher, PA-C    Family History Family History  Problem Relation Age of Onset  .  Hypertension Mother   . Diabetes Maternal Grandmother     Social History Social History   Tobacco Use  . Smoking status: Passive Smoke Exposure - Never Smoker  . Smokeless tobacco: Never Used  . Tobacco comment: boyfriend smokes  Vaping Use  . Vaping Use: Never used  Substance Use Topics  . Alcohol use: No  . Drug use: No     Allergies   Patient has no known allergies.   Review of Systems Review of Systems  Constitutional: Positive for chills and fever.  HENT: Positive for congestion and sore throat.   Respiratory: Positive for cough. Negative for shortness of breath.   Cardiovascular: Negative for chest pain.  Genitourinary: Negative.   Musculoskeletal: Positive for arthralgias and myalgias.  Skin: Negative.      Physical Exam Triage Vital Signs ED Triage Vitals  Enc Vitals Group     BP 05/20/20 0842 121/85     Pulse Rate 05/20/20 0842 95     Resp 05/20/20 0842 18     Temp 05/20/20 0842 (!) 101.3 F (38.5 C)     Temp Source 05/20/20 0842 Oral     SpO2 05/20/20 0842 97 %  Weight --      Height --      Head Circumference --      Peak Flow --      Pain Score 05/20/20 0845 5     Pain Loc --      Pain Edu? --      Excl. in GC? --    No data found.  Updated Vital Signs BP 121/85 (BP Location: Left Arm)   Pulse 95   Temp (!) 101.3 F (38.5 C) (Oral)   Resp 18   LMP  (LMP Unknown)   SpO2 97%   Visual Acuity Right Eye Distance:   Left Eye Distance:   Bilateral Distance:    Right Eye Near:   Left Eye Near:    Bilateral Near:     Physical Exam Vitals and nursing note reviewed.  Constitutional:      General: She is not in acute distress.    Appearance: She is not ill-appearing.  Cardiovascular:     Rate and Rhythm: Normal rate and regular rhythm.     Pulses: Normal pulses.     Heart sounds: Normal heart sounds.  Pulmonary:     Effort: Pulmonary effort is normal.     Breath sounds: Normal breath sounds.  Neurological:     Mental Status:  She is alert.      UC Treatments / Results  Labs (all labs ordered are listed, but only abnormal results are displayed) Labs Reviewed  NOVEL CORONAVIRUS, NAA  RESP PANEL BY RT-PCR (FLU A&B, COVID) ARPGX2    EKG   Radiology No results found.  Procedures Procedures (including critical care time)  Medications Ordered in UC Medications  acetaminophen (TYLENOL) tablet 650 mg (650 mg Oral Given 05/20/20 0851)    Initial Impression / Assessment and Plan / UC Course  I have reviewed the triage vital signs and the nursing notes.  Pertinent labs & imaging results that were available during my care of the patient were reviewed by me and considered in my medical decision making (see chart for details).     1.  Flulike symptoms: COVID-19 PCR test has been sent Increase oral fluid intake Please quarantine until COVID-19 test results are available Ibuprofen as needed for body aches Tessalon Perles as needed for cough We will call you with recommendations if your labs are abnormal Return precautions given. Final Clinical Impressions(s) / UC Diagnoses   Final diagnoses:  Encounter for screening laboratory testing for COVID-19 virus  Flu-like symptoms     Discharge Instructions     Please increase oral fluid intake Tylenol/Motrin as needed fever and/body aches Please quarantine until COVID-19 test results are available. Take medications as directed     ED Prescriptions    Medication Sig Dispense Auth. Provider   ibuprofen (ADVIL) 800 MG tablet Take 1 tablet (800 mg total) by mouth 3 (three) times daily. 21 tablet Castella Lerner, Britta Mccreedy, MD   benzonatate (TESSALON) 100 MG capsule  (Status: Discontinued) Take 1 capsule (100 mg total) by mouth every 8 (eight) hours. 21 capsule Leeland Lovelady, Britta Mccreedy, MD   benzonatate (TESSALON) 100 MG capsule Take 1 capsule (100 mg total) by mouth every 8 (eight) hours. 21 capsule Pallie Swigert, Britta Mccreedy, MD     PDMP not reviewed this encounter.    Merrilee Jansky, MD 05/22/20 [phone]

## 2020-05-20 NOTE — Discharge Instructions (Signed)
Please increase oral fluid intake Tylenol/Motrin as needed fever and/body aches Please quarantine until COVID-19 test results are available. Take medications as directed

## 2020-05-23 LAB — NOVEL CORONAVIRUS, NAA: SARS-CoV-2, NAA: NOT DETECTED

## 2020-06-01 ENCOUNTER — Encounter: Payer: Medicaid Other | Admitting: Family

## 2020-06-01 NOTE — Progress Notes (Signed)
Establish care

## 2020-06-01 NOTE — Progress Notes (Signed)
Patient did not show for appointment. °

## 2020-12-31 ENCOUNTER — Inpatient Hospital Stay
Admit: 2020-12-31 | Discharge: 2020-12-31 | Disposition: A | Discharge status: Home / Self Care | Payer: MEDICAID | Attending: Emergency Medicine

## 2020-12-31 DIAGNOSIS — N390 Urinary tract infection, site not specified: Secondary | ICD-10-CM

## 2020-12-31 LAB — URINALYSIS W/ RFLX MICROSCOPIC
Bilirubin, Urine: NEGATIVE
Bilirubin: NEGATIVE
Glucose, Ur: NEGATIVE mg/dL
Glucose: NEGATIVE mg/dL
Ketone: NEGATIVE mg/dL
Ketones, Urine: NEGATIVE mg/dL
Nitrite, Urine: NEGATIVE
Nitrites: NEGATIVE
Protein, UA: NEGATIVE mg/dL
Protein: NEGATIVE mg/dL
Specific Gravity, UA: 1.016 (ref 1.005–1.030)
Specific gravity: 1.016 (ref 1.005–1.030)
Urobilinogen, UA, POCT: 1 EU/dL (ref 0.2–1.0)
Urobilinogen: 1 EU/dL (ref 0.2–1.0)
pH (UA): 7 (ref 5.0–8.0)
pH, UA: 7 (ref 5.0–8.0)

## 2020-12-31 LAB — HCG URINE, QL
HCG urine, QL: NEGATIVE
Pregnancy, Urine: NEGATIVE

## 2020-12-31 LAB — URINE MICROSCOPIC

## 2020-12-31 LAB — WET PREP

## 2020-12-31 MED ORDER — METRONIDAZOLE 500 MG TAB
500 mg | ORAL_TABLET | Freq: Two times a day (BID) | ORAL | 0 refills | Status: AC
Start: 2020-12-31 — End: 2021-01-07

## 2020-12-31 MED ORDER — CEPHALEXIN 500 MG CAP
500 mg | ORAL_CAPSULE | Freq: Three times a day (TID) | ORAL | 0 refills | Status: AC
Start: 2020-12-31 — End: 2021-01-07

## 2020-12-31 NOTE — ED Provider Notes (Signed)
ED Provider Notes by Ernestina PatchesHenriquez, Emmanuela Ghazi F, MD at 12/31/20 1107                Author: Ernestina PatchesHenriquez, Baileigh Modisette F, MD  Service: EMERGENCY  Author Type: Physician       Filed: 12/31/20 1216  Date of Service: 12/31/20 1107  Status: Signed          Editor: Ernestina PatchesHenriquez, Moria Brophy F, MD (Physician)               EMERGENCY DEPARTMENT HISTORY AND PHYSICAL EXAM           Date: 12/31/2020   Patient Name: Susan Ruiz           History of Presenting Illness          Chief Complaint       Patient presents with        ?  Urinary Odor           History Provided By: Patient      HPI: Susan Ruiz, [age] y.o. female with no significant past medical history that presents to the ED with cc of vaginal discharge. Pt states that  she had unprotected sex a month ago and since then she's been having a vaginal discharge and that her urine smells like eggs. She took on PCN that a cousin had and she has been douching w/o help; no itching; no dysuria.       There are no other complaints, changes, or physical findings at this time.      PCP: None              Past History     Past Medical History:   History reviewed. No pertinent past medical history.      Past Surgical History:   History reviewed. No pertinent surgical history.      Family History:   History reviewed. No pertinent family history.      Social History:     Social History          Tobacco Use         ?  Smoking status:  Every Day              Packs/day:  0.50         Types:  Cigarettes         ?  Smokeless tobacco:  Never       Vaping Use         ?  Vaping Use:  Never used       Substance Use Topics         ?  Alcohol use:  Not Currently         ?  Drug use:  Never           Allergies:   No Known Allergies     Review of Systems     Review of Systems    Constitutional:  Negative for fever.    Gastrointestinal:  Negative for abdominal pain.    Genitourinary:  Positive for vaginal discharge. Negative for dysuria, flank pain, genital sores and pelvic pain.    Skin:  Negative for rash.          Physical Exam     Physical Exam   Vitals and nursing note reviewed.    Constitutional:        General: She is not in acute distress.      Appearance: She is obese.    HENT:  Head: Normocephalic.    Eyes:       Extraocular Movements: Extraocular movements intact.       Conjunctiva/sclera: Conjunctivae normal.    Pulmonary:       Effort: Pulmonary effort is normal.     Abdominal:       Palpations: Abdomen is soft.       Tenderness: There is no abdominal tenderness.    Genitourinary:      Comments: Normal external genitalia w/o rash; white vaginal discharge; cervix looks normal; no CMT or adnexal tenderness. RN Sheran Lawless           Lab and Diagnostic Study Results     Labs -         Recent Results (from the past 12 hour(s))     URINALYSIS W/ RFLX MICROSCOPIC          Collection Time: 12/31/20 11:00 AM         Result  Value  Ref Range            Color  Yellow          Appearance  Cloudy          Specific gravity  1.016  1.005 - 1.030         pH (UA)  7.0  5.0 - 8.0         Protein  Negative  Negative mg/dL       Glucose  Negative  Negative mg/dL       Ketone  Negative  Negative mg/dL       Bilirubin  Negative  Negative         Blood  NON HEMOLYTIC TRACE (A)  Negative         Urobilinogen  1.0  0.2 - 1.0 EU/dL       Nitrites  Negative  Negative         Leukocyte Esterase  Large (A)  Negative         HCG URINE, QL          Collection Time: 12/31/20 11:00 AM         Result  Value  Ref Range            HCG urine, QL  Negative  Negative         WET PREP          Collection Time: 12/31/20 11:00 AM       Specimen: Vaginal Specimen         Result  Value  Ref Range            Clue cells  Moderate          Wet prep  FEW WBCS, NO YEAST OR TRICHOMONAS SEEN         URINE MICROSCOPIC          Collection Time: 12/31/20 11:00 AM         Result  Value  Ref Range            WBC  20-50  0 - 4 /hpf       RBC  0-5  0 - 2 /hpf       Epithelial cells  Moderate  0 - 20 /lpf       Bacteria  1+ (A)  None /hpf            Trichomonas   Present              Radiologic  Studies -    @LASTXRRESULT @     CT Results  (Last 48 hours)             None                    CXR Results  (Last 48 hours)             None                       Medical Decision Making and ED Course     - I am the first and primary provider for this patient AND AM THE PRIMARY PROVIDER OF RECORD. I reviewed the vital signs, available  nursing notes, past medical history, past surgical history, family history and social history.      - Initial assessment performed. The patients presenting problems have been discussed, and the staff are in agreement with the care plan formulated and outlined with them.  I have encouraged them to ask questions as they arise throughout their visit.      Differential Diagnosis & Medical Decision Making Provider Note:          Vital Signs-Reviewed the patient's vital signs.   Patient Vitals for the past 12 hrs:            Temp  Pulse  Resp  BP  SpO2            12/31/20 1029  97 ??F (36.1 ??C)  97  16  132/85  100 %              ED Course:             Procedures and Critical Care                Disposition     Disposition:       Discharged      DISCHARGE PLAN:   1. There are no discharge medications for this patient.      2.      Follow-up Information                  Follow up With  Specialties  Details  Why  Contact Info              01/02/21, NP  Family Nurse Practitioner, Nurse Practitioner  Schedule an appointment as soon as possible for a visit in 1 week    [address]   [address]   [place]   [phone]                     3.  Return to ED if worse    4.      Current Discharge Medication List                 START taking these medications          Details        cephALEXin (Keflex) 500 mg capsule  Take 1 Capsule by mouth three (3) times daily for 7 days.   Qty: 21 Capsule, Refills: 0   Start date: 12/31/2020, End date: 01/07/2021               metroNIDAZOLE (FlagyL) 500 mg tablet  Take 1 Tablet by mouth two (2) times a day for 7  days.   Qty: 14 Tablet, Refills: 0   Start date: 12/31/2020, End date: 01/07/2021  Diagnosis/Clinical Impression        Clinical Impression:       1.  Urinary tract infection without hematuria, site unspecified         2.  Trichomonas vaginalis (TV) infection            Attestations: I,  Ernestina Patches, MD, am the primary clinician of record.        Please note that this dictation was completed with Dragon, the computer voice recognition software.  Quite often unanticipated grammatical, syntax, homophones, and other interpretive errors are inadvertently  transcribed by the computer software.  Please disregard these errors.  Please excuse any errors that have escaped final proofreading.  Thank you.

## 2020-12-31 NOTE — ED Notes (Signed)
PT. Reports to ER with C/O urinary odor x 1 week. She describes as a "rotten egg smell". She also reports "white and chunky vaginal discharge". She stated that "I got some penicillin from my cousin and took one pill".

## 2021-01-01 LAB — CHLAMYDIA/NEISSERIA AMPLIFICATION
Chlamydia amplification: NEGATIVE
N. gonorrhoeae amplification: NEGATIVE

## 2021-01-01 LAB — C.TRACHOMATIS N.GONORRHOEAE DNA
Chlamydia trachomatis, NAA: NEGATIVE
Neisseria Gonorrhoeae, NAA: NEGATIVE

## 2021-01-03 LAB — CULTURE, URINE
Colonies Counted: >100000
Colony Count: >100000

## 2021-02-23 ENCOUNTER — Inpatient Hospital Stay
Admit: 2021-02-23 | Discharge: 2021-02-23 | Disposition: A | Discharge status: Home / Self Care | Payer: MEDICAID | Attending: Emergency Medicine

## 2021-02-23 DIAGNOSIS — N390 Urinary tract infection, site not specified: Secondary | ICD-10-CM

## 2021-02-23 LAB — URINALYSIS W/ RFLX MICROSCOPIC
Bilirubin, Urine: NEGATIVE
Bilirubin: NEGATIVE
Blood, Urine: NEGATIVE
Blood: NEGATIVE
Glucose, Ur: NEGATIVE mg/dL
Glucose: NEGATIVE mg/dL
Ketone: NEGATIVE mg/dL
Ketones, Urine: NEGATIVE mg/dL
Nitrite, Urine: NEGATIVE
Nitrites: NEGATIVE
Protein, UA: NEGATIVE mg/dL
Protein: NEGATIVE mg/dL
Specific Gravity, UA: 1.023 (ref 1.005–1.030)
Specific gravity: 1.023 (ref 1.005–1.030)
Urobilinogen, UA, POCT: 0.2 EU/dL (ref 0.2–1.0)
Urobilinogen: 0.2 EU/dL (ref 0.2–1.0)
pH (UA): 6 (ref 5.0–8.0)
pH, UA: 6 (ref 5.0–8.0)

## 2021-02-23 LAB — URINE MICROSCOPIC

## 2021-02-23 LAB — HCG URINE, QL
HCG urine, QL: NEGATIVE
Pregnancy, Urine: NEGATIVE

## 2021-02-23 MED ORDER — AZITHROMYCIN 250 MG TAB
250 mg | ORAL | Status: AC
Start: 2021-02-23 — End: 2021-02-23
  Administered 2021-02-23: 17:00:00 via ORAL

## 2021-02-23 MED ORDER — NITROFURANTOIN (25% MACROCRYSTAL FORM) 100 MG CAP
100 mg | ORAL_CAPSULE | Freq: Two times a day (BID) | ORAL | 0 refills | Status: AC
Start: 2021-02-23 — End: 2021-02-26

## 2021-02-23 MED ORDER — LIDOCAINE (PF) 10 MG/ML (1 %) IJ SOLN
101 mg/mL (1 %) | Freq: Once | INTRAMUSCULAR | Status: AC
Start: 2021-02-23 — End: 2021-02-23
  Administered 2021-02-23: 17:00:00 via INTRAMUSCULAR

## 2021-02-23 MED FILL — CEFTRIAXONE 1 GRAM SOLUTION FOR INJECTION: 1 gram | INTRAMUSCULAR | Qty: 1

## 2021-02-23 MED FILL — AZITHROMYCIN 250 MG TAB: 250 mg | ORAL | Qty: 4

## 2021-02-23 NOTE — ED Notes (Signed)
 Pt states she started with pelvic pain around 6am  Pt states she got up and made a bowel movement  BM was loose,  pt states she felt better after that, but she layed back down and the pain came back.   Pt states she was taking meds in August for chlamydia, but she just stopped taking them because they made her use the bathroom  Pt denies any vaginal discharge or urinary symptoms.

## 2021-02-23 NOTE — ED Provider Notes (Signed)
ED Provider Notes by Sung Amabile, MD at 02/23/21 1050                Author: Sung Amabile, MD  Service: --  Author Type: Physician       Filed: 02/23/21 1246  Date of Service: 02/23/21 1050  Status: Addendum          Editor: Sung Amabile, MD (Physician)          Related Notes: Original Note by Sung Amabile, MD (Physician) filed at 02/23/21 1245                EMERGENCY DEPARTMENT HISTORY AND PHYSICAL EXAM           Date: 02/23/2021   Patient Name: Susan Ruiz        History of Presenting Illness          Chief Complaint       Patient presents with        ?  Abdominal Pain        ?  Pelvic Pain           History Provided By: Patient      HPI: Susan Ruiz, 20 y.o. female No PMHx presents with low pelvic pain. Started 5 hours ago after a BM. Denies other symptoms. States she had Chlamydia  but did not finish treatment 1 or 2 months ago. LMP was last month. No NVD or fever.      There are no other complaints, changes, or physical findings at this time.      PCP: None        No current facility-administered medications on file prior to encounter.          No current outpatient medications on file prior to encounter.             Past History        Past Medical History:   History reviewed. No pertinent past medical history.      Past Surgical History:   History reviewed. No pertinent surgical history.      Family History:   History reviewed. No pertinent family history.      Social History:     Social History          Tobacco Use         ?  Smoking status:  Every Day              Packs/day:  0.50         Types:  Cigarettes         ?  Smokeless tobacco:  Never       Vaping Use         ?  Vaping Use:  Never used       Substance Use Topics         ?  Alcohol use:  Not Currently         ?  Drug use:  Never           Allergies:   No Known Allergies        Review of Systems     Review of Systems    Constitutional: Negative.     HENT: Negative.      Respiratory: Negative.      Cardiovascular:  Negative.     Gastrointestinal: Negative.     Genitourinary:  Positive for pelvic pain.    Neurological: Negative.     All other  systems reviewed and are negative.        Physical Exam     Physical Exam   Vitals and nursing note reviewed.    Constitutional:        General: She is not in acute distress.      Appearance: She is well-developed. She is obese. She is not ill-appearing.    HENT:       Head: Normocephalic and atraumatic.    Cardiovascular:       Rate and Rhythm: Normal rate and regular rhythm.    Pulmonary:       Effort: Pulmonary effort is normal.       Breath sounds: Normal breath sounds.     Abdominal:       General: Abdomen is protuberant.       Palpations: Abdomen is soft.       Tenderness: There is no abdominal tenderness.     Neurological:       General: No focal deficit present.       Mental Status: She is alert and oriented to person, place, and time.            Lab and Diagnostic Study Results     Labs -         Recent Results (from the past 12 hour(s))       URINALYSIS W/ RFLX MICROSCOPIC          Collection Time: 02/23/21 11:25 AM         Result  Value  Ref Range            Color  Yellow          Appearance  Cloudy          Specific gravity  1.023  1.005 - 1.030         pH (UA)  6.0  5.0 - 8.0         Protein  Negative  Negative mg/dL       Glucose  Negative  Negative mg/dL       Ketone  Negative  Negative mg/dL       Bilirubin  Negative  Negative         Blood  Negative  Negative         Urobilinogen  0.2  0.2 - 1.0 EU/dL       Nitrites  Negative  Negative         Leukocyte Esterase  Trace (A)  Negative         HCG URINE, QL          Collection Time: 02/23/21 11:25 AM         Result  Value  Ref Range            HCG urine, QL  Negative  Negative         URINE MICROSCOPIC          Collection Time: 02/23/21 11:25 AM         Result  Value  Ref Range            WBC  10-20  0 - 4 /hpf       RBC  0-5  0 - 2 /hpf       Epithelial cells  Many  0 - 20 /lpf            Bacteria  2+ (A)  None /hpf  Radiologic Studies -    @lastxrresult @     CT Results  (Last 48 hours)             None                    CXR Results  (Last 48 hours)             None                       Medical Decision Making and ED Course     Differential Diagnosis & Medical Decision Making Provider Note:          - I am the first provider for this patient.  I reviewed the vital signs, available nursing notes, past medical history, past surgical history, family history and social history. The patients presenting problems have been discussed, and they are in agreement  with the care plan formulated and outlined with them.  I have encouraged them to ask questions as they arise throughout their visit.      Vital Signs-Reviewed the patient's vital signs.   Patient Vitals for the past 12 hrs:            Temp  Pulse  Resp  BP  SpO2            02/23/21 1016  98.7 ??F (37.1 ??C)  68  16  (!) 147/94  100 %           ED Course:           HYPERTENSION COUNSELING: Education was provided to the patient today regarding their hypertension. Patient is made aware of their elevated blood pressure and is instructed to follow up this week with  their Primary Care for a recheck. Patient is counseled regarding consequences of chronic, uncontrolled hypertension including kidney disease, heart disease, stroke or even death. Patient states their understanding and agrees to follow up this week. Additionally,  during their visit, I discussed sodium restriction, maintaining ideal body weight and regular exercise program as physiologic means to achieve blood pressure control. The patient will strive towards this.      Procedures     Performed by: 04/25/21, MD   Procedures          Disposition     Disposition: Condition stable and improved   DC- Adult Discharges: All of the diagnostic tests were reviewed and questions answered. Diagnosis, care plan and treatment options were discussed.  The patient understands the instructions and will follow up as directed. The  patients results have been  reviewed with them.  They have been counseled regarding their diagnosis.  The patient verbally convey understanding and agreement of the signs, symptoms, diagnosis, treatment and prognosis and additionally agrees to follow up as recommended with their  PCP in 24 - 48 hours.  They also agree with the care-plan and convey that all of their questions have been answered.  I have also put together some discharge instructions for them that include: 1) educational information regarding their diagnosis, 2)  how to care for their diagnosis at home, as well a 3) list of reasons why they would want to return to the ED prior to their follow-up appointment, should their condition change.      DISCHARGE PLAN:   1. There are no discharge medications for this patient.      2.      Follow-up Information  Follow up With  Specialties  Details  Why  Contact Info              Council, Deloris C, NP-C  Nurse Practitioner  In 1 week    [address]   [place] [address]   [phone]                     3.  Return to ED if worse    4.      Current Discharge Medication List                 START taking these medications          Details        nitrofurantoin, macrocrystal-monohydrate, (Macrobid) 100 mg capsule  Take 1 Capsule by mouth two (2) times a day for 3 days.   Qty: 6 Capsule, Refills: 0   Start date: 02/23/2021, End date: 02/26/2021                       Remove if admitted/transferred        Diagnosis/Clinical Impression        Clinical Impression:       1.  Urinary tract infection without hematuria, site unspecified            Attestations: I, Sung Amabile, MD, am the primary clinician of record.      Please note that this dictation was completed with Dragon, the computer voice recognition software.  Quite often unanticipated grammatical, syntax, homophones, and other interpretive errors are inadvertently  transcribed by the computer software.  Please disregard these errors.   Please excuse any errors that have escaped final proofreading.  Thank you.

## 2021-02-24 LAB — CHLAMYDIA/NEISSERIA AMPLIFICATION
Chlamydia amplification: NEGATIVE
N. gonorrhoeae amplification: NEGATIVE

## 2021-02-24 LAB — C.TRACHOMATIS N.GONORRHOEAE DNA
Chlamydia trachomatis, NAA: NEGATIVE
Neisseria Gonorrhoeae, NAA: NEGATIVE

## 2021-03-03 ENCOUNTER — Other Ambulatory Visit: Payer: MEDICAID | Primary: Student in an Organized Health Care Education/Training Program

## 2021-03-03 ENCOUNTER — Inpatient Hospital Stay: Admit: 2021-03-04 | Primary: Student in an Organized Health Care Education/Training Program

## 2021-03-04 ENCOUNTER — Ambulatory Visit
Attending: Student in an Organized Health Care Education/Training Program | Primary: Student in an Organized Health Care Education/Training Program

## 2021-03-04 ENCOUNTER — Ambulatory Visit
Admit: 2021-03-04 | Payer: MEDICAID | Attending: Student in an Organized Health Care Education/Training Program | Primary: Student in an Organized Health Care Education/Training Program

## 2021-03-04 DIAGNOSIS — N3000 Acute cystitis without hematuria: Secondary | ICD-10-CM

## 2021-03-04 LAB — SENTARA SPECIMEN COLLN.

## 2021-03-04 MED ORDER — TRIMETHOPRIM-SULFAMETHOXAZOLE 160 MG-800 MG TAB
160-800 mg | ORAL_TABLET | Freq: Two times a day (BID) | ORAL | 0 refills | Status: AC
Start: 2021-03-04 — End: 2021-03-14

## 2021-03-04 MED ORDER — MEDROXYPROGESTERONE (DEPO-PROVERA) 150 MG/ML IM SYRINGE
150 mg/mL | Freq: Once | INTRAMUSCULAR | 0 refills | Status: AC
Start: 2021-03-04 — End: 2021-03-04

## 2021-03-04 NOTE — Progress Notes (Signed)
No bacteria grew in your urine culture.  This may be due to the prior antibiotics you were taking.  They also did see some yeast which may be due to yeast infection.  If your symptoms do not improve with the antibiotic I prescribed at your last visit, I suggest we try a yeast medication next.

## 2021-03-04 NOTE — Addendum Note (Signed)
Addendum  Note by Lorita Officer. at 03/04/21 1030                Author: Lorita Officer.  Service: --  Chartered loss adjuster Type: Medical Assistant       Filed: 03/10/21 1403  Encounter Date: 03/04/2021  Status: Signed          Editor: Lorita Officer. Engineer, building services)          Addended by: Lorita Officer. on: 03/10/2021 02:03 PM    Modules accepted: Orders

## 2021-03-04 NOTE — Progress Notes (Signed)
Patient came in took pregnacy test it was negative. I proceeded with the Depo Injection. Patient sat for 15 mins. Patient tolerated injection well.

## 2021-03-04 NOTE — Progress Notes (Signed)
Progress  Notes by Susan Staggers, MD at 03/04/21 1030                Author: Hermina Staggers, MD  Service: --  Author Type: Physician       Filed: 03/04/21 1433  Encounter Date: 03/04/2021  Status: Signed          Editor: Susan Staggers, MD (Physician)                       Susan Ruiz (DOB: [birth year]) is a 20 y.o. female, new patient, here for evaluation of the following chief complaint(s):   Establish Care (Talk getting on birth control)          Assessment/Plan:   1. Acute cystitis without hematuria-last urine culture showed Proteus mirabilis that was only resistant to nitrofurantoin.  Nitrofurantoin is the  antibiotic that the ED gave her.  We will switch to Bactrim given and sensitivity.  Repeat urine culture today.   -     URINALYSIS W/MICROSCOPIC; Future   -     CULTURE, URINE; Future   -     trimethoprim-sulfamethoxazole (BACTRIM DS, SEPTRA DS) 160-800 mg per tablet; Take 1 Tablet by mouth two (2) times a day for 10 days., Normal, Disp-20 Tablet, R-0   2. Dysmenorrhea-irregular cycles likely due to obesity.  Given patient's request to suppress ovulation, will start Depo-Provera.   Sent to pharmacy.  Patient to return to clinic for nurse visit for injection and pregnancy test beforehand.   -     medroxyPROGESTERone (DEPO-PROVERA) 150 mg/mL syrg; 1 mL by IntraMUSCular route once for 1 dose., Normal, Disp-1 Each, R-0      Return for Depo shot.         Subjective/Objective:   HPI   Establish Care- Susan Ruiz. Medical office classes      Urinary Complaints-dysuria and urinary frequency.  Treated 1 week ago with Macrobid.  Of note per chart review her last urine culture was Proteus mirabilis that was resistant only to Macrobid.      STI history-patient endorses history of genital herpes and chlamydia in the past.  She was recently treated in the emergency room for trichomonas and chlamydia however actual testing of both of these extremities was negative.         Physical Exam   Blood pressure 118/72,  pulse 71, temperature 97.4 ??F (36.3 ??C), temperature source Tympanic, resp. rate 14, height 5\' 7"  (1.702 m), weight 297 lb 2 oz (134.8 kg), last menstrual period 02/14/2021,  SpO2 98 %. Body mass index is 46.54 kg/m??.   Physical Exam   Vitals reviewed.    Constitutional:        General: She is not in acute distress.   HENT:       Head: Normocephalic and atraumatic.       Right Ear: Tympanic membrane and ear canal normal.       Left Ear: Tympanic membrane and ear canal normal.       Nose: Nose normal.       Mouth/Throat:       Mouth: Mucous membranes are moist.       Pharynx: No oropharyngeal exudate or posterior oropharyngeal erythema.    Eyes:       Extraocular Movements: Extraocular movements intact.       Conjunctiva/sclera: Conjunctivae normal.     Cardiovascular:       Rate and Rhythm: Normal rate and regular rhythm.  Pulmonary:       Effort: Pulmonary effort is normal. No respiratory distress.       Breath sounds: Normal breath sounds.     Abdominal:       General: There is no distension.       Palpations: Abdomen is soft.    Skin:      General: Skin is warm and dry.    Neurological:       Mental Status: She is alert and oriented to person, place, and time. Mental status is at baseline.                 Medical History- Reviewed  Social History- Reviewed  Surgical History- Reviewed         Susan Ruiz has no past medical history on file.  Susan Ruiz reports that she has been smoking cigarettes. She has been smoking an average of .5 packs per day. She has never used smokeless tobacco. She reports  that she does not currently use alcohol. She reports that she does not use drugs.  Susan Ruiz has no past surgical history on file.                Problem List- Reviewed       Susan Ruiz does not have a problem list on file.             Current Outpatient Medications        Medication  Instructions         ?  medroxyPROGESTERone (DEPO-PROVERA)  150 mg, IntraMUSCular, ONCE         ?  trimethoprim-sulfamethoxazole (BACTRIM DS, SEPTRA  DS) 160-800 mg per tablet  1 Tablet, Oral, 2 TIMES DAILY           On this date 03/04/2021 I have spent 30 minutes reviewing previous notes, test results and face to face with the patient discussing the diagnosis and importance of compliance with the treatment plan as well as documenting on the day of the visit.      An electronic signature was used to authenticate this note.   -- Susan Staggers, MD

## 2021-03-04 NOTE — Progress Notes (Signed)
 1. Have you been to the ER, urgent care clinic since your last visit?  Hospitalized since your last visit? Yes 02/23/21 ER  abdominal pain    2. Have you seen or consulted any other health care providers outside of the [facility] since your last visit? No     3. For patients aged 21-75: Has the patient had a colonoscopy / FIT/ Cologuard? NA - based on age      If the patient is female:    4. For patients aged 6-74: Has the patient had a mammogram within the past 2 years? NA - based on age or sex      64. For patients aged 21-65: Has the patient had a pap smear? Yes - no Care Gap present    Chief Complaint   Patient presents with    Establish Care     Talk getting on birth control

## 2021-03-05 ENCOUNTER — Encounter: Primary: Student in an Organized Health Care Education/Training Program

## 2021-03-05 NOTE — Telephone Encounter (Signed)
Patient called requesting a prior Auth for her medroxyPROGESTERone (DEPO-PROVERA) 150 mg/mL syrg, Please revdiew and advise. Patient would like a call once PA is submitted.

## 2021-03-07 LAB — URINALYSIS W/MICROSCOPIC
Bilirubin: NEGATIVE
Blood: NEGATIVE
Glucose: NEGATIVE mg/dL
Ketone: NEGATIVE mg/dL
Leukocyte Esterase: NEGATIVE
Nitrites: NEGATIVE
Protein: NEGATIVE mg/dL
Specific Gravity: 1.023 (ref 1.005–1.03)
Urobilinogen: 0.2 mg/dL (ref <2.0)
pH (UA): 6.5 pH (ref 5.0–8.0)

## 2021-03-07 LAB — CULTURE, URINE
RESULT: NORMAL
Result: NORMAL

## 2021-03-07 LAB — URINALYSIS WITH MICROSCOPIC
Bilirubin, Urine: NEGATIVE
Blood, Urine: NEGATIVE
Glucose, UA: NEGATIVE mg/dL
Ketones, Urine: NEGATIVE mg/dL
Leukocyte Esterase, Urine: NEGATIVE
Nitrite, Urine: NEGATIVE
Protein, UA: NEGATIVE mg/dL
Specific Gravity, UA: 1.023 NA (ref 1.005–1.03)
Urobilinogen, Urine: 0.2 mg/dL (ref <2.0)
pH, UA: 6.5 pH (ref 5.0–8.0)

## 2021-03-10 LAB — AMB POC URINE PREGNANCY TEST, VISUAL COLOR COMPARISON
HCG urine, Ql. (POC): NEGATIVE
HCG, Pregnancy, Urine, POC: NEGATIVE

## 2021-03-10 MED ORDER — MEDROXYPROGESTERONE 150 MG/ML INTRAMUSCULAR SUSPENSION
150 mg/mL | Freq: Once | INTRAMUSCULAR | Status: AC
Start: 2021-03-10 — End: 2021-03-10
  Administered 2021-03-10: 18:00:00 via INTRAMUSCULAR

## 2021-03-18 ENCOUNTER — Encounter
Attending: Student in an Organized Health Care Education/Training Program | Primary: Student in an Organized Health Care Education/Training Program

## 2021-03-18 MED ORDER — SERTRALINE 50 MG TAB
50 mg | ORAL_TABLET | Freq: Every day | ORAL | 1 refills | Status: DC
Start: 2021-03-18 — End: 2021-05-12

## 2021-03-18 MED ORDER — FLUCONAZOLE 150 MG TAB
150 mg | ORAL_TABLET | Freq: Every day | ORAL | 0 refills | Status: AC
Start: 2021-03-18 — End: 2021-03-19

## 2021-03-18 NOTE — Progress Notes (Signed)
Progress  Notes by Hermina Staggers, MD at 03/18/21 1330                Author: Hermina Staggers, MD  Service: --  Author Type: Physician       Filed: 03/18/21 1545  Encounter Date: 03/18/2021  Status: Signed          Editor: Hermina Staggers, MD (Physician)                       Susan Ruiz (DOB: 20-Mar-2001) is a 20 y.o. female, established patient, here for evaluation of the following chief complaint(s):   Yeast Infection          Assessment/Plan:   1. Moderate episode of recurrent major depressive disorder (HCC)-present with 8/9 diagnostic criteria.  Has had episodes of depression in the past,  has never been on an SSRI.  Trial of Zoloft.  Discussed risk and benefits.  Patient prefers to follow-up weekly.  Recommend calling insurance for recommendations regarding therapies in network.     -     sertraline (ZOLOFT) 50 mg tablet; Take 1 Tablet by mouth daily., Normal, Disp-30 Tablet, R-1   2. Candida vaginitis-symptom based diagnosis.  Likely due to recent antibiotic use.  Diflucan.   -     fluconazole (DIFLUCAN) 150 mg tablet; Take 1 Tablet by mouth daily for 1 day. FDA advises cautious prescribing of oral fluconazole in pregnancy., Normal, Disp-1 Tablet,  R-0   3. Recurrent UTI-discussed preventative measures including good hydration, postcoital urination, and hygienic practices such  as wiping front to back.      Return in about 1 week (around 03/25/2021) for virtual visit for depression.         Subjective/Objective:   HPI      Vaginal discharge- non-odorous yellow discharge. Present the last 2 days. Finished Abx about 2 days ago.       Mood: Has been decreased.  Cried on the way to an appointment today without a clear reason.   S- Sleep is poor, toss and turn frequently. Lay down around 12-2am, wake up around 8:30am.     I- Decreased interest in old activities.    G- Guilt regarding relationship with mom and the patient getting blamed by everything she never did.      E- Low energy.    C- poor, failing  two classes at this time. Hard to focus, sometimes distracted and worrying about other things.    A- "I eat like a rat"   P-denies   S- SI without a plan. Previously cut wrist yrs ago (41yrs)            Physical Exam   Blood pressure 122/82, temperature 98 ??F (36.7 ??C), temperature source Tympanic, resp. rate 14, weight 291 lb (132 kg), last menstrual period 02/14/2021, SpO2 98 %. Body mass index is 45.58 kg/m??.   Physical Exam   Constitutional :        Appearance: Normal appearance.    HENT:       Head: Normocephalic and atraumatic.       Right Ear: External ear normal.       Left Ear: External ear normal.    Eyes:       Extraocular Movements: Extraocular movements intact.       Conjunctiva/sclera: Conjunctivae normal.    Pulmonary:       Effort: No respiratory distress.    Neurological :       General:  No focal deficit present.       Mental Status: She is alert and oriented to person, place, and time. Mental status is at baseline.              Medical History- Reviewed  Social History- Reviewed  Surgical History- Reviewed         Susan Ruiz has no past medical history on file.  Susan Ruiz reports that she has been smoking cigarettes. She has been smoking an average of .5 packs per day. She has never used smokeless tobacco. She reports  that she does not currently use alcohol. She reports that she does not use drugs.  Susan Ruiz has no past surgical history on file.                Problem List- Reviewed       Susan Ruiz has Moderate episode of recurrent major depressive disorder (HCC) and Recurrent UTI on their problem list.             Current Outpatient Medications        Medication  Instructions         ?  fluconazole (DIFLUCAN)  150 mg, Oral, DAILY, FDA advises cautious prescribing of oral fluconazole in pregnancy.         ?  sertraline (ZOLOFT)  50 mg, Oral, DAILY                 An electronic signature was used to authenticate this note.   -- Hermina Staggers, MD

## 2021-03-18 NOTE — Progress Notes (Signed)
 1. Have you been to the ER, urgent care clinic since your last visit?  Hospitalized since your last visit? No    2. Have you seen or consulted any other health care providers outside of the [facility] since your last visit? No     3. For patients aged 21-75: Has the patient had a colonoscopy / FIT/ Cologuard? NA - based on age      If the patient is female:    4. For patients aged 83-74: Has the patient had a mammogram within the past 2 years? NA - based on age or sex      103. For patients aged 21-65: Has the patient had a pap smear? NA - based on age or sex    Chief Complaint   Patient presents with    Yeast Infection       Patient have some concerns of having anxiety.

## 2021-03-22 NOTE — Telephone Encounter (Signed)
Pt called to speak to [facility] in regards to a form that needs to be completed for work. Informed pt that Sunny Schlein was in patient care at the moment. Pt would like a call back as soon as possible. Please review and advise

## 2021-03-25 ENCOUNTER — Ambulatory Visit
Admit: 2021-03-25 | Payer: MEDICAID | Attending: Student in an Organized Health Care Education/Training Program | Primary: Student in an Organized Health Care Education/Training Program

## 2021-03-25 DIAGNOSIS — G8929 Other chronic pain: Secondary | ICD-10-CM

## 2021-03-25 NOTE — Progress Notes (Signed)
 Chief Complaint   Patient presents with    Depression    Knee Pain    Form Completion     Patient here today for depression follow up she says she feels that her current treatment is working well she feels more energized and says she sleeps better. Patient does have some nausea with taking her medications. She has concerns with left knee pain she had an injury to her left knee as a child says a nail went through her knee. She has had increased pain in left knee as she has gotten older and has never been treated for this. She also has forms she would like to be completed today for reasonable accommodations.     1. Have you been to the ER, urgent care clinic since your last visit?  Hospitalized since your last visit? No    2. Have you seen or consulted any other health care providers outside of the [facility] since your last visit? No     3. For patients aged 26-75: Has the patient had a colonoscopy / FIT/ Cologuard? NA - based on age      If the patient is female:    4. For patients aged 71-74: Has the patient had a mammogram within the past 2 years? NA - based on age or sex      55. For patients aged 21-65: Has the patient had a pap smear? No

## 2021-03-25 NOTE — Progress Notes (Signed)
Progress  Notes by Hermina Staggers, MD at 03/25/21 1030                Author: Hermina Staggers, MD  Service: --  Author Type: Physician       Filed: 03/25/21 1352  Encounter Date: 03/25/2021  Status: Signed          Editor: Hermina Staggers, MD (Physician)                       Susan Ruiz (DOB: 06-01-2000) is a 20 y.o. female, established patient, here for evaluation of the following chief complaint(s):   Depression, Knee Pain, and Form Completion          Assessment/Plan:   1. Chronic pain of left knee-no previous evaluation with x-rays of symptomatic left knee.  Will get due to the chronicity of this symptom.  Given  exam today and history I am concerned for meniscal injury as well of the Osgood-Schlatter's disease.  I have given her a handout for stretches and exercises for Osgood-Schlatter's.  Depending course, she may need dedicated physical therapy at this does  not improve would likely need MRI and orthopedics evaluation.  NSAIDs as needed.    -     XR KNEE LT 3 V; Future   2. Osgood-Schlatter's disease, -as above    3. Moderate episode of recurrent major depressive disorder (HCC)-symptoms improving with Zoloft 50 mg.  Continue current dose.  Follow-up in 2  weeks for further counseling and evaluation.      Return in about 2 weeks (around 04/08/2021) for depression follow up.         Subjective/Objective:   HPI      Depression- Feels like zoloft is helping with energy, concentration and sleep. Some nausea but is manageable.       L knee pain- Previous injury [facility] [employer] several years ago, she fell on door threshold on an exposed screw. Now scar present. Occasional pain with L knee. Sometimes knee pops when doing twisting motion and then associated pain. Went to Urgent  care in the past and was given muscle relaxers with no real benefit.    Meds attempted: Motrin rarely.          Physical Exam   Blood pressure 126/82, pulse 68, temperature 97.4 ??F (36.3 ??C), temperature source Tympanic,  resp. rate 16, height 5\' 7"  (1.702 m), weight 291 lb (132 kg), SpO2 97 %. Body mass index is 45.58  kg/m??.   Physical Exam   Constitutional :        Appearance: Normal appearance.    HENT:       Head: Normocephalic and atraumatic.       Right Ear: External ear normal.       Left Ear: External ear normal.    Eyes:       Extraocular Movements: Extraocular movements intact.       Conjunctiva/sclera: Conjunctivae normal.    Pulmonary:       Effort: No respiratory distress.     Musculoskeletal:       Comments: No bony abnormalities to palpation of bilateral knees.   Left knee with tenderness to patellar tendon and tibial tuberosity.   Left knee with negative anterior and posterior drawer test.  McMurray testing limited by pain, palpable click but not worsening of the pain with this.     Neurological:       General: No focal deficit present.  Mental Status: She is alert and oriented to person, place, and time. Mental status is at baseline.              Medical History- Reviewed  Social History- Reviewed  Surgical History- Reviewed         Kelee has no past medical history on file.  Melaina reports that she has been smoking cigarettes. She has been smoking an average of .5 packs per day. She has never used smokeless tobacco. She reports  that she does not currently use alcohol. She reports that she does not use drugs.  Jonasia has no past surgical history on file.                Problem List- Reviewed       Cielle has Moderate episode of recurrent major depressive disorder (HCC) and Recurrent UTI on their problem list.             Current Outpatient Medications        Medication  Instructions         ?  sertraline (ZOLOFT)  50 mg, Oral, DAILY                 An electronic signature was used to authenticate this note.   -- Hermina Staggers, MD

## 2021-04-01 ENCOUNTER — Encounter
Payer: MEDICAID | Attending: Student in an Organized Health Care Education/Training Program | Primary: Student in an Organized Health Care Education/Training Program

## 2021-04-12 ENCOUNTER — Encounter: Attending: Family Medicine | Primary: Student in an Organized Health Care Education/Training Program

## 2021-04-29 ENCOUNTER — Ambulatory Visit
Admit: 2021-04-29 | Payer: MEDICAID | Attending: Student in an Organized Health Care Education/Training Program | Primary: Student in an Organized Health Care Education/Training Program

## 2021-04-29 ENCOUNTER — Encounter
Attending: Student in an Organized Health Care Education/Training Program | Primary: Student in an Organized Health Care Education/Training Program

## 2021-04-29 DIAGNOSIS — Z01419 Encounter for gynecological examination (general) (routine) without abnormal findings: Secondary | ICD-10-CM

## 2021-04-29 MED ORDER — MEDROXYPROGESTERONE (DEPO-PROVERA) 150 MG/ML IM SYRINGE
150 mg/mL | INTRAMUSCULAR | 3 refills | Status: DC
Start: 2021-04-29 — End: 2021-04-29

## 2021-04-29 MED ORDER — MEDROXYPROGESTERONE (DEPO-PROVERA) 150 MG/ML IM SYRINGE
150 mg/mL | INTRAMUSCULAR | 3 refills | Status: AC
Start: 2021-04-29 — End: ?

## 2021-04-29 NOTE — Progress Notes (Signed)
Progress  Notes by Hermina Staggers, MD at 04/29/21 1430                Author: Hermina Staggers, MD  Service: --  Author Type: Physician       Filed: 04/29/21 1521  Encounter Date: 04/29/2021  Status: Signed          Editor: Hermina Staggers, MD (Physician)                       Susan Ruiz (DOB: [birth year]) is a 20 y.o. female, established patient, here for evaluation of the following chief complaint(s):   Well Woman          Assessment/Plan:   1. Well woman exam with routine gynecological exam-normal pelvic and breast exam.  STI testing sent per patient request.  Follow-up based on results.   -     PAP IG, CT-NG-TV, RFX APTIMA HPV ASCUS ([id number]); Future   2. Dysmenorrhea-some breakthrough bleeding symptoms with Depo.  However patient prefers to continue this.  Refills sent.   -     medroxyPROGESTERone (DEPO-PROVERA) 150 mg/mL syrg; ADMINISTER 1 ML IN THE MUSCLE 1 TIME FOR 1 DOSE, Normal, Disp-1 Each, R-3   3. Moderate episode of recurrent major depressive disorder (HCC)-doing well on current dose of Zoloft.  Continue medication  at current dose.      Return in about 6 weeks (around 06/10/2021) for depo shot.         Subjective/Objective:   HPI      Menses suppression- Had depo on 10/26. Right after first injection had bleeding for about 2 weeks.       Depression-feels more here, not as much weight on conscious. Eating more healthy.  Overall more positive and better outlook.      Physical Exam   Blood pressure 132/88, pulse 76, temperature 98 ??F (36.7 ??C), resp. rate 10, weight 292 lb (132.5 kg), SpO2 99 %. Body mass index is 45.73 kg/m??.   Physical Exam   Constitutional :        Appearance: Normal appearance.    HENT:       Head: Normocephalic and atraumatic.       Right Ear: Tympanic membrane and external ear normal.       Left Ear: Tympanic membrane and external ear normal.       Nose: Nose normal.       Mouth/Throat:       Mouth: Mucous membranes are moist.       Pharynx: Oropharynx is clear.     Eyes:       Extraocular Movements: Extraocular movements intact.       Conjunctiva/sclera: Conjunctivae normal.     Cardiovascular:       Rate and Rhythm: Normal rate and regular rhythm.    Pulmonary:       Effort: Pulmonary effort is normal. No respiratory distress.       Breath sounds: Normal breath sounds.    Genitourinary:      General: Normal vulva.       Vagina: No vaginal discharge.     Skin:      General: Skin is warm and dry.    Neurological:       General: No focal deficit present.       Mental Status: She is alert and oriented to person, place, and time.              Medical History- Reviewed  Social History- Reviewed  Surgical History- Reviewed         [name] has no past medical history on file.  [name] reports that she has been smoking cigarettes. She has been smoking an average of .5 packs per day. She has never used smokeless tobacco. She reports  that she does not currently use alcohol. She reports that she does not use drugs.  [name] has no past surgical history on file.                Problem List- Reviewed       [name] has Moderate episode of recurrent major depressive disorder (HCC) and Recurrent UTI on their problem list.             Current Outpatient Medications        Medication  Instructions         ?  medroxyPROGESTERone (DEPO-PROVERA) 150 mg/mL syrg  ADMINISTER 1 ML IN THE MUSCLE 1 TIME FOR 1 DOSE         ?  sertraline (ZOLOFT)  50 mg, Oral, DAILY                 An electronic signature was used to authenticate this note.   -- Hermina Staggers, MD

## 2021-04-29 NOTE — Progress Notes (Signed)
Chief Complaint   Patient presents with    Well Woman     Pt in office for follow up and PAP.

## 2021-05-05 LAB — CHLAM/GC/TRICH NUCL AMP THIN PREP
CHLAMYDIA TRACHOMATIS THINPREP: NEGATIVE
NEISSERIA GONORRHOEAE THINPREP: NEGATIVE
TRICHOMONAS NUC AMP-THIN PREP: POSITIVE — AB

## 2021-05-05 LAB — PAP IG (IMAGE GUIDED)

## 2021-05-07 ENCOUNTER — Encounter

## 2021-05-07 MED ORDER — METRONIDAZOLE 500 MG TAB
500 mg | ORAL_TABLET | Freq: Two times a day (BID) | ORAL | 0 refills | Status: AC
Start: 2021-05-07 — End: 2021-05-14

## 2021-05-07 NOTE — Progress Notes (Signed)
7-day course of Flagyl sent to pharmacy for trichomonas found on Pap.

## 2021-05-10 DIAGNOSIS — K047 Periapical abscess without sinus: Secondary | ICD-10-CM

## 2021-05-10 NOTE — ED Notes (Signed)
Left lower dental pain x 2 weeks

## 2021-05-10 NOTE — ED Provider Notes (Signed)
ED Provider Notes by Arizona Constable, MD at 05/10/21 2210                Author: Arizona Constable, MD  Service: EMERGENCY  Author Type: Physician       Filed: 05/10/21 2212  Date of Service: 05/10/21 2210  Status: Signed          Editor: Arizona Constable, MD (Physician)               EMERGENCY DEPARTMENT HISTORY AND PHYSICAL EXAM           Date: 05/10/2021   Patient Name: Susan Ruiz           History of Presenting Illness          Chief Complaint       Patient presents with        ?  Dental Pain           History Provided By: Patient      HPI: Susan Ruiz, 20 y.o. female with a past medical history significant for denies presents to the ED with cc of tooth pain. Pt has had  ~2wks of L lower jaw pain. Had 4 cavities filled by dentist ~35mo ago. Denies F/C/N/V/D, trismus, SOB, neck stiffness.      There are no other complaints, changes, or physical findings at this time.      PCP: Hermina Staggers, MD        Current Facility-Administered Medications             Medication  Dose  Route  Frequency  Provider  Last Rate  Last Admin              ?  HYDROcodone-acetaminophen (NORCO) 5-325 mg per tablet 1 Tablet   1 Tablet  Oral  NOW  Hollie Bartus, MD                Current Outpatient Medications          Medication  Sig  Dispense  Refill           ?  amoxicillin-clavulanate (Augmentin) 875-125 mg per tablet  Take 1 Tablet by mouth two (2) times a day for 7 days.  14 Tablet  0     ?  HYDROcodone-acetaminophen (Norco) 5-325 mg per tablet  Take 1 Tablet by mouth every six (6) hours as needed for Pain for up to 3 days. Max Daily Amount: 4 Tablets.  7 Tablet  0     ?  metroNIDAZOLE (FLAGYL) 500 mg tablet  Take 1 Tablet by mouth two (2) times a day for 7 days.  14 Tablet  0     ?  medroxyPROGESTERone (DEPO-PROVERA) 150 mg/mL syrg  ADMINISTER 1 ML IN THE MUSCLE 1 TIME FOR 1 DOSE  1 Each  3           ?  sertraline (ZOLOFT) 50 mg tablet  Take 1 Tablet by mouth daily.  30 Tablet  1             Past History        Past  Medical History:   History reviewed. No pertinent past medical history.      Past Surgical History:   History reviewed. No pertinent surgical history.      Family History:     Family History         Problem  Relation  Age of Onset          ?  Hypertension  Mother       ?  No Known Problems  Father       ?  No Known Problems  Sister       ?  Diabetes  Maternal Grandmother       ?  Hypertension  Maternal Grandmother       ?  No Known Problems  Maternal Grandfather       ?  No Known Problems  Paternal Grandmother            ?  No Known Problems  Paternal Grandfather             Social History:     Social History          Tobacco Use         ?  Smoking status:  Former              Packs/day:  0.50         Types:  Cigarettes         ?  Smokeless tobacco:  Never       Vaping Use         ?  Vaping Use:  Every day     ?  Substances:  Nicotine       Substance Use Topics         ?  Alcohol use:  Not Currently     ?  Drug use:  Yes              Types:  Marijuana           Allergies:   No Known Allergies           Review of Systems     Constitutional: Negative except as in HPI.   Eyes: Negative except as in HPI.   ENT: Negative except as in HPI.   Cardiovascular: Negative except as in HPI.   Respiratory: Negative except as in HPI.   Gastrointestinal: Negative except as in HPI.   Genitourinary: Negative except as in HPI.   Musculoskeletal: Negative except as in HPI.   Integumentary: Negative except as in HPI.   Neurological: Negative except as in HPI.   Psychiatric: Negative except as in HPI.   Endocrine: Negative except as in HPI.   Hematologic/Lymphatic: Negative except as in HPI.   Allergic/Immunologic: Negative except as in HPI.        Physical Exam     Constitutional: Awake and alert, interactive, NAD   Eyes: PERRL, no injection or scleral icterus, no discharge   HEENT: NCAT, neck supple, MMM, no oropharyngeal exudates, no floor of mouth tenderness or trismus,  filling/caries of teeth #17 and #19, tooth #19 ttp, no gumline  swelling   CV: RRR, no m/r/g   Respiratory: CTAB, no r/r/w   MSK: no joint effusions or edema   Skin: No rashes   Neuro: CN2-12 intact, symmetric facies, fluent speech.   Psych: Well-groomed, normal speech, behavior, appropriate mood        Lab and Diagnostic Study Results        Labs -    No results found for this or any previous visit (from the past 12 hour(s)).      Radiologic Studies -    @LASTXRRESULT @     CT Results  (Last 48 hours)             None  CXR Results  (Last 48 hours)             None                       Medical Decision Making and ED Course     - I am the first and primary provider for this patient AND AM THE PRIMARY PROVIDER OF RECORD.      - I reviewed the vital signs, available nursing notes, past medical history, past surgical history, family history and social history.      - Initial assessment performed. The patients presenting problems have been discussed, and the staff are in agreement with the care plan formulated and outlined with them.  I have encouraged them to ask questions as they arise throughout their visit.      Vital Signs-Reviewed the patient's vital signs.      Patient Vitals for the past 12 hrs:            Temp  Pulse  Resp  BP  SpO2            05/10/21 2204  98.6 ??F (37 ??C)  (!) 116  18  (!) 132/94  98 %           EKG interpretation:             Provider Notes (Medical Decision Making):    79F w/dental caries, will give augmentin abx therapy, norco for pain for here and breakthrough. Has dentist for f/up, return precautions discussed.      ED Course:                      Disposition        Disposition: DC- Adult Discharges: All of the diagnostic tests were reviewed and questions answered. Diagnosis, care plan and treatment options  were discussed.  The patient understands the instructions and will follow up as directed. The patients results have been reviewed with them.  They have been counseled regarding their diagnosis.  The patient verbally convey  understanding and agreement  of the signs, symptoms, diagnosis, treatment and prognosis and additionally agrees to follow up as recommended with their PCP in 24 - 48 hours.  They also agree with the care-plan and convey that all of their questions have been answered.  I have also  put together some discharge instructions for them that include: 1) educational information regarding their diagnosis, 2) how to care for their diagnosis at home, as well a 3) list of reasons why they would want to return to the ED prior to their follow-up  appointment, should their condition change.      Discharged           Diagnosis        Clinical Impression:       1.  Dental infection            Attestations:      Arizona Constable, MD

## 2021-05-11 ENCOUNTER — Inpatient Hospital Stay
Admit: 2021-05-11 | Discharge: 2021-05-11 | Disposition: A | Discharge status: Home / Self Care | Payer: MEDICAID | Attending: Emergency Medicine

## 2021-05-11 MED ORDER — HYDROCODONE-ACETAMINOPHEN 5 MG-325 MG TAB
5-325 mg | ORAL_TABLET | Freq: Four times a day (QID) | ORAL | 0 refills | Status: AC | PRN
Start: 2021-05-11 — End: 2021-05-13

## 2021-05-11 MED ORDER — AMOXICILLIN CLAVULANATE 875 MG-125 MG TAB
875-125 mg | ORAL_TABLET | Freq: Two times a day (BID) | ORAL | 0 refills | Status: AC
Start: 2021-05-11 — End: 2021-05-17

## 2021-05-11 MED ORDER — HYDROCODONE-ACETAMINOPHEN 5 MG-325 MG TAB
5-325 mg | ORAL | Status: DC
Start: 2021-05-11 — End: 2021-05-10

## 2021-05-11 MED ORDER — IBUPROFEN 400 MG TAB
400 mg | ORAL | Status: AC
Start: 2021-05-11 — End: 2021-05-10
  Administered 2021-05-11: 04:00:00 via ORAL

## 2021-05-11 MED FILL — IBUPROFEN 400 MG TAB: 400 mg | ORAL | Qty: 1

## 2021-05-11 MED FILL — HYDROCODONE-ACETAMINOPHEN 5 MG-325 MG TAB: 5-325 mg | ORAL | Qty: 1

## 2021-05-12 ENCOUNTER — Encounter

## 2021-05-13 MED ORDER — SERTRALINE 50 MG TAB
50 mg | ORAL_TABLET | Freq: Every day | ORAL | 1 refills | Status: AC
Start: 2021-05-13 — End: ?

## 2021-05-13 NOTE — Progress Notes (Signed)
Pap without signs of early cancer. Repeat in 3 yrs.    Tx for Trich sent in

## 2021-05-16 ENCOUNTER — Encounter

## 2021-06-10 ENCOUNTER — Encounter

## 2021-06-11 ENCOUNTER — Institutional Professional Consult (permissible substitution): Admit: 2021-06-11 | Payer: MEDICAID | Primary: Student in an Organized Health Care Education/Training Program

## 2021-06-11 DIAGNOSIS — Z3042 Encounter for surveillance of injectable contraceptive: Secondary | ICD-10-CM

## 2021-06-11 NOTE — Progress Notes (Signed)
Ms. Hall-Burns verified DOB Administered Dep-Prevnar  injection. Left side gluteus maxium. Patient tolerated well.

## 2021-06-12 ENCOUNTER — Encounter

## 2021-07-30 ENCOUNTER — Inpatient Hospital Stay: Admit: 2021-07-30 | Payer: MEDICAID | Primary: Student in an Organized Health Care Education/Training Program

## 2021-07-30 ENCOUNTER — Ambulatory Visit
Admit: 2021-07-30 | Payer: MEDICAID | Attending: Student in an Organized Health Care Education/Training Program | Primary: Student in an Organized Health Care Education/Training Program

## 2021-07-30 DIAGNOSIS — R58 Hemorrhage, not elsewhere classified: Secondary | ICD-10-CM

## 2021-07-30 LAB — SENTARA SPECIMEN COLLECTION

## 2021-07-30 MED ORDER — SERTRALINE HCL 100 MG PO TABS
100 MG | ORAL_TABLET | Freq: Every day | ORAL | 5 refills | Status: AC
Start: 2021-07-30 — End: 2021-09-03

## 2021-07-30 NOTE — Progress Notes (Signed)
Susan Ruiz (DOB: Nov 05, 2000) is a 21 y.o. female, established patient, here for evaluation of the following chief complaint(s):  Menstrual Problem       Assessment/Plan:  1. Blood loss-menorrhagia secondary to Depo.  Has had 2 injections and heavy prolonged periods with both.  We will stop use of Depo, however current Depo will still be in her system for the next month.  Afterwards can talk about alternate contraception.  Patient requesting condoms however will need to purchase these out-of-pocket.  Screening blood work for anemia  -     CBC; Future  -     Ferritin; Future  -     Iron and TIBC; Future  2. Fatigue, unspecified type-screening for anemia and other reversible causes of fatigue.  May be more so related to blood loss and mood disorder.  -     CBC; Future  -     Comprehensive Metabolic Panel; Future  -     TSH with Reflex; Future  3. Major depressive disorder, recurrent, moderate (HCC)-minimal benefit from Zoloft 50 mg we will titrate up to more treatment dose of 100 mg.  -     sertraline (ZOLOFT) 100 MG tablet; Take 1 tablet by mouth daily, Disp-30 tablet, R-5Normal  4. Family history of diabetes mellitus  -     Hemoglobin A1C; Future  5. Class 3 severe obesity due to excess calories without serious comorbidity with body mass index (BMI) of 45.0 to 49.9 in adult [facility]) diet and lifestyle modifications for weight loss. Dietary principles from http://www.wall-moore.info/ for a healthy, varied diet. Reduce caloric intake. Recommend cumulatively 150-minutes of moderate intensity exercise or 75-minutes of vigorous activity weekly.  -     Hemoglobin A1C; Future    Return in about 6 weeks (around 09/10/2021) for Depression follow-up.      Subjective/Objective:  HPI  Natures bounty, hair, skin and nail    Menorrhagia-last period started about a month ago.  It has been going on since then and more recently more heavy.  Has had Depo shot x2 last injection 06/11/2021.  Overall feels more more fatigued although this may be  secondary to poor sleep.  No past hemoglobins in chart.      Mood- Taking Zoloft 50 mg 8pm. Go to sleep but then wakes up consistently at 3am. Tired throughout the day. Snore but not described as loud, denies apneic events.  Endorses having outbursts of anger and irritability.    PHQ-9  07/30/2021 03/25/2021 03/18/2021   Little interest or pleasure in doing things 1 1 0   Feeling down, depressed, or hopeless 1 1 0   Trouble falling or staying asleep, or sleeping too much 3 0 -   Feeling tired or having little energy 3 3 -   Poor appetite or overeating 0 1 -   Feeling bad about yourself - or that you are a failure or have let yourself or your family down 0 1 -   Trouble concentrating on things, such as reading the newspaper or watching television 3 3 -   Moving or speaking so slowly that other people could have noticed. Or the opposite - being so fidgety or restless that you have been moving around a lot more than usual 0 1 -   Thoughts that you would be better off dead, or of hurting yourself in some way 0 - -   PHQ-2 Score 2 2 0   PHQ-9 Total Score 11 11 0   If you checked  off any problems, how difficult have these problems made it for you to do your work, take care of things at home, or get along with other people? 2 - -   How difficult have these problems made it for you to do your work, take care of your home and get along with others - Very difficult -           Vitals  Blood pressure 110/78, pulse 72, temperature 97.9 ??F (36.6 ??C), temperature source Tympanic, resp. rate 16, weight 296 lb (134.3 kg), last menstrual period 07/12/2021, SpO2 98 %. Body mass index is 46.36 kg/m??.  General appearance - Alert, NAD, normal appearance.   Head - Atraumatic. Normocephalic.   Eyes - EOMI. Sclera white.   Respiratory - Pulmonary effort is normal. No respiratory distress.   Neurological - Grossly no focal deficits. Speech normal.   Psychiatric - Mood normal. Behavior normal.     Medical History- Reviewed Social History-  Reviewed Surgical History- Reviewed   Susan Ruiz has no past medical history on file. Susan Ruiz reports that she has never smoked. She has never used smokeless tobacco. She reports that she does not drink alcohol and does not use drugs. Susan Ruiz has no past surgical history on file.         Problem List- Reviewed   Susan Ruiz has Moderate episode of recurrent major depressive disorder (HCC) and Recurrent UTI on their problem list.       Current Outpatient Medications   Medication Instructions    medroxyPROGESTERone (DEPO-PROVERA) 150 MG/ML injection ADMINISTER 1 ML IN THE MUSCLE 1 TIME FOR 1 DOSE    sertraline (ZOLOFT) 100 mg, Oral, DAILY           An electronic signature was used to authenticate this note.  -- Hermina Staggers, MD

## 2021-07-30 NOTE — Patient Instructions (Signed)
Iron Rich Nutrition Therapy    How Much Iron Do You Need?  The amount of iron you need each day is measured in milligrams (mg). The general recommendations each day for healthy people are:  Women (Ages 19-50): 18 mg  Women (Ages 19-50): 27 mg if pregnant; 9 mg  if breastfeeding  Men (Ages 19 years and older): 8 mg  Older women (Ages 51 years and older): 8 mg    How Much Iron Does Your Child Need?  Infants, 0-6 months: 0.27 mg 7-12 months:  11 mg  Child 1-3 years: 7 mg 4-8 years 10 mg 9-13  years 8 mg  Teen boys, 14-18 years: 11 mg  Teen girls, 14-18 years: 15 mg    General guidelines  Iron from meat, poultry, and fish is better absorbed than iron from plants.  Limit coffee and tea at mealtimes so as not to decrease iron absorption.  Include foods high in Vitamin C such as citrus juice and fruits, melons, berries and dark green leafy vegetables with your meals. This may help your body absorb more iron.  Eat iron-enriched grain products or foods prepared with iron-enriched flour.   Many cereals contain 18 mg iron, or 100% of adult daily needs per serving, such as: Total, 100% Bran flakes, and Grapenuts. Having a serving of one of these iron rich cereals each day can help you meet daily iron intake.  Try cooking with iron cookware, which will add some iron to whatever is prepared.     Supplementation  Ferrous Sulfate 325mg can be used to supplement if Ferritin levels are less than 15 mg/ml in women or less than 30 mg/ml in men.  Iron helps carry oxygen throughout your body. If you are not eating enough iron rich foods in your diet, you may feel tired and run down.    Food Serving Size Iron (mg)   Meats and Alternatives     Beans, cooked: black, garbanzo, kidney, lima, pinto, white 1/2 cup 1.6-4.0   Beef liver 3.5 oz 7   Beef, veal, lamb (most cuts) 3 oz 2-3   Chicken 3 oz 1   Chili w/ meat and beans 1 cup 8.75   Clams, cooked 3 oz 11   Egg 1 large 1   Fish: haddock, halibut, perch, salmon, trout, tuna 3 oz 0.7-1.3    Ground beef 3.5 oz 2.9   Lentils 1/2 cup 3.5   Nuts: almonds, cashews, pine nuts, pistachios, walnuts 1 oz 1.0-1.6   Oysters, canned 3 oz 5.7   Pork 3 oz 2.7   Pork & beans 1/2 cup 4.5   Refried beans 1/2 cup 2.1   Seeds: pumpkin 1 oz 4.2   Seeds: sesame, sunflower 1 oz 1.2   Shrimp, cooked 3 oz 2.6   Soymilk 1 cup 2.7   Tofu, firm 1/2 cup 3.4   Turkey, dark meat 3 oz 2   Turkey, white meat 3 oz 1.2        Grains     Bagel, 4": onion, sesame, poppy 1 each 5.4   Bagel, 4": raisin 1 each 3.5   Brown rice, cooked 1 cup 1.03   Enriched Cold cereals (Cheerios, Life, Product 19, Mini Wheats) 1/2 cup 8-18   Enriched Hot cereals (Malt o meal, Oatmeal, Cream of Wheat) 1/2 cup 6.3-8.1   Enriched Medium grain rice, cooked 1 cup 2.77   Enriched white rice, cooked 1 cup 2.25   Unenriched white rice, cooked 1 cup 0.41          Vegetables and Fruit     Apricots, dried 10 1.7   Artichoke, boiled 1 medium 3.87   Asparagus 1/2 cup or 6 spears 2   Beets 1/2 cup 1.5   Edamame/baby soybeans, cooked 1/2 cup 2.25   Greens: chard, collard, beet, turnip 1/2 cup cooked or 1 cup fresh 1.2-2.0   Mushrooms 1/2 cup 1.4   Peas, green, cooked from frozen 1/2 cup 1.2   Prunes, dried 10 2.08   Raisins 2/3 cup 2.08   Spinach 1/2 cup cooked or 1 cup fresh 2.0-3.4

## 2021-07-30 NOTE — Progress Notes (Signed)
Chief Complaint   Patient presents with    Menstrual Problem       Patient states cycle starte 27th of feb and still bleeding. Pt states she is having lots of clots. And having lots of fatigue.    1. "Have you been to the ER, urgent care clinic since your last visit?  Hospitalized since your last visit?" Yes Reason for visit: abcess    2. "Have you seen or consulted any other health care providers outside of the [facility] since your last visit?" No     3. For patients aged 46-75: Has the patient had a colonoscopy / FIT/ Cologuard? NA - based on age      If the patient is female:    4. For patients aged 34-74: Has the patient had a mammogram within the past 2 years? NA - based on age or sex      56. For patients aged 21-65: Has the patient had a pap smear? No

## 2021-07-31 LAB — HEMOGLOBIN A1C
Estimated Avg Glucose, External: 99 mg/dL (ref 91–123)
Estimated Avg Glucose: 99 mg/dL (ref 91–123)
Hemoglobin A1C, External: 5.1 % (ref 4.8–5.6)
Hemoglobin A1C: 5.1 % (ref 4.8–5.6)

## 2021-07-31 LAB — CBC
Hematocrit: 38.9 % (ref 35.1–46.5)
Hemoglobin: 12 g/dL (ref 11.7–15.5)
MCH: 27 pg (ref 26–34)
MCHC: 31 g/dL (ref 31–36)
MCV: 89 fL (ref 80–99)
MPV: 11.3 fL (ref 9.0–13.0)
Platelets: 299 10*3/uL (ref 140–440)
RBC: 4.39 M/uL (ref 3.80–5.20)
RDW: 13.6 % (ref 10.0–15.5)
WBC: 5.8 10*3/uL (ref 4.0–11.0)

## 2021-07-31 LAB — FERRITIN: Ferritin: 103 ng/mL (ref 10–291)

## 2021-07-31 LAB — TSH: TSH: 0.62 uU/mL (ref 0.27–4.20)

## 2021-08-23 ENCOUNTER — Ambulatory Visit
Admit: 2021-08-23 | Payer: MEDICAID | Attending: Student in an Organized Health Care Education/Training Program | Primary: Student in an Organized Health Care Education/Training Program

## 2021-08-23 DIAGNOSIS — L739 Follicular disorder, unspecified: Secondary | ICD-10-CM

## 2021-08-23 MED ORDER — MUPIROCIN 2 % EX OINT
2 % | CUTANEOUS | 0 refills | Status: AC
Start: 2021-08-23 — End: 2021-08-30

## 2021-08-23 NOTE — Progress Notes (Signed)
Chief Complaint   Patient presents with    Mass     Patient say she noticed a painful "bump" on her right side. She says she noticed this yesterday while driving and says the area is painful.. Patient says she had a fall on Sunday 4/2 while she was getting down form standing in a chair hanging something on her wall she is concerned this could be medication side effect.     1. "Have you been to the ER, urgent care clinic since your last visit?  Hospitalized since your last visit?" No    2. "Have you seen or consulted any other health care providers outside of the [facility] since your last visit?" No     3. For patients aged 59-75: Has the patient had a colonoscopy / FIT/ Cologuard? NA - based on age      If the patient is female:    4. For patients aged 40-74: Has the patient had a mammogram within the past 2 years? NA - based on age or sex      19. For patients aged 21-65: Has the patient had a pap smear? Yes - no Care Gap present

## 2021-08-23 NOTE — Progress Notes (Signed)
Susan Ruiz (DOB: 04/22/2001) is a 21 y.o. female, established patient, here for evaluation of the following chief complaint(s):  Mass       Assessment/Plan:  1. Folliculitis-possible early abscess.  As lesion is already coming to ahead, will observe, encourage warm compress and if it drains manual massage.  Mupirocin 3 times daily for the next week.  Will contact clinic if worsening and will send oral antibiotic.  -     mupirocin (BACTROBAN) 2 % ointment; Apply topically 3 times daily. Use for 7 days., Disp-15 g, R-0, Normal  2. Moderate episode of recurrent major depressive disorder (HCC)-still with difficulty concentrating at work.  Requesting continued accommodation for increased break of 2 10-minute breaks daily.  Work note given today.  Continue Zoloft 100 mg      Return in about 4 weeks (around 09/20/2021) for routine check up and hormonal medication management.      Subjective/Objective:  HPI    Skin problem-patient noticed painful lump to right flank 2 days ago.  Worsening pain and swelling.  Just above the belt line.  Denies fever, chills, nausea, vomiting.  No history of resistant skin infections.  Denies drainage, bleeding.      Vitals  Blood pressure 118/68, pulse 74, temperature 97.4 F (36.3 C), temperature source Tympanic, height 5\' 7"  (1.702 m), weight 299 lb (135.6 kg), last menstrual period 07/12/2021, SpO2 99 %. Body mass index is 46.83 kg/m.  General appearance - Alert, NAD, normal appearance  Head - Atraumatic. Normocephalic.   Eyes - EOMI. Sclera white.   Ears - Hearing grossly normal.    Respiratory - LCTAB. Effort normal, without respiratory distress. No wheeze/rale/rhonchi  Heart - Normal rate, regular rhythm. No m/r/r  Neurological - No gross focal deficits. Speech normal. Alert and oriented. Mental status is at baseline.   Musculoskeletal - Grossly normal ROM, Gait normal.    Skin -right flank with 1.5 cm mass suspicious for abscess with early pustule head.  Mild erythema and  tenderness to palpation.       Medical History- Reviewed Social History- Reviewed Surgical History- Reviewed   Susan Ruiz has no past medical history on file. Susan Ruiz reports that she has never smoked. She has never used smokeless tobacco. She reports that she does not drink alcohol and does not use drugs. Susan Ruiz has no past surgical history on file.         Problem List- Reviewed   Susan Ruiz has Moderate episode of recurrent major depressive disorder (HCC); Recurrent UTI; and Class 3 severe obesity due to excess calories without serious comorbidity with body mass index (BMI) of 45.0 to 49.9 in adult [facility]) on their problem list.       Current Outpatient Medications   Medication Instructions    mupirocin (BACTROBAN) 2 % ointment Apply topically 3 times daily. Use for 7 days.    sertraline (ZOLOFT) 100 mg, Oral, DAILY           An electronic signature was used to authenticate this note.  -- [facility], MD

## 2021-09-03 ENCOUNTER — Ambulatory Visit
Admit: 2021-09-03 | Discharge: 2021-09-14 | Payer: MEDICAID | Attending: Student in an Organized Health Care Education/Training Program | Primary: Student in an Organized Health Care Education/Training Program

## 2021-09-03 DIAGNOSIS — M79609 Pain in unspecified limb: Secondary | ICD-10-CM

## 2021-09-03 MED ORDER — DICLOFENAC SODIUM 1 % EX GEL
1 % | Freq: Four times a day (QID) | CUTANEOUS | 1 refills | Status: DC
Start: 2021-09-03 — End: 2021-12-05

## 2021-09-03 MED ORDER — SERTRALINE HCL 100 MG PO TABS
100 MG | ORAL_TABLET | Freq: Every day | ORAL | 1 refills | Status: DC
Start: 2021-09-03 — End: 2022-03-30

## 2021-09-03 NOTE — Progress Notes (Signed)
Susan Ruiz (DOB: 2001-04-22) is a 21 y.o. female, established patient, here for evaluation of the following chief complaint(s):  Depression and Leg Injury (bruise)       Assessment/Plan:  1. Trigger point of extremity- L lateral leg after fall. Start voltaren with heat and massage. If not better in 2-3 weeks, return for trigger point injection.   -     diclofenac sodium (VOLTAREN) 1 % GEL; Apply 4 g topically 4 times daily, Topical, 4 TIMES DAILY Starting Fri 09/03/2021, Disp-150 g, R-1, Normal  2. Major depressive disorder, recurrent, moderate (HCC)  -     sertraline (ZOLOFT) 100 MG tablet; Take 1 tablet by mouth daily, Disp-90 tablet, R-1Normal  3. Inattention- Long term chronic problem. Does not appear associated with depression as other symptoms have improved but this has persisted. Would like ADHD workup with Neuro[facility]. Info given for providers in the area that do ADHD testing.       Return in about 3 months (around 12/03/2021) for routine check up.      Subjective/Objective:  HPI    Larey Seat- Occurred beginning of April. Was standing on chair to move something, chair broke. Fell on L side. Was limping for a few days. Now feels like there are knots there. Pain now is 7-8/10. Worse lying on L side at night with pressure. Nothing seems to help.   - Meds attempted- Motrin short term    Inattention- fidgety, having to move around a lot. Hard to focus on any one thing. Struggled through school with this. Mother did not seek medical attention for this. Feels like Zoloft helped all the other depression symptoms but not that.     PHQ-9  09/03/2021 09/03/2021 07/30/2021   Little interest or pleasure in doing things 2 2 1    Feeling down, depressed, or hopeless 2 2 1    Trouble falling or staying asleep, or sleeping too much 3 3 3    Feeling tired or having little energy 3 3 3    Poor appetite or overeating 3 3 0   Feeling bad about yourself - or that you are a failure or have let yourself or your family down 1 1 0    Trouble concentrating on things, such as reading the newspaper or watching television 3 3 3    Moving or speaking so slowly that other people could have noticed. Or the opposite - being so fidgety or restless that you have been moving around a lot more than usual 3 3 0   Thoughts that you would be better off dead, or of hurting yourself in some way 0 0 0   PHQ-2 Score 4 4 2    PHQ-9 Total Score 20 20 11    If you checked off any problems, how difficult have these problems made it for you to do your work, take care of things at home, or get along with other people? 2 2 2    How difficult have these problems made it for you to do your work, take care of your home and get along with others - - -         Physical Exam  Blood pressure 134/76, pulse 81, temperature 97.8 F (36.6 C), resp. rate 16, height 5\' 7"  (1.702 m), weight (!) 301 lb (136.5 kg), SpO2 99 %. Body mass index is 47.14 kg/m.  General appearance - Alert, NAD, normal appearance.   Head - Atraumatic. Normocephalic.   Eyes - EOMI. Sclera white.   Respiratory - Pulmonary effort is normal.  No respiratory distress.   Neurological - No gross focal deficits. Speech normal.   Psychiatric - Mood normal. Behavior normal.     Medical History- Reviewed Social History- Reviewed Surgical History- Reviewed   Susan Ruiz has no past medical history on file. Susan Ruiz reports that she has never smoked. She has never used smokeless tobacco. She reports that she does not drink alcohol and does not use drugs. Susan Ruiz has no past surgical history on file.         Problem List- Reviewed   Susan Ruiz has Moderate episode of recurrent major depressive disorder (HCC); Recurrent UTI; and Class 3 severe obesity due to excess calories without serious comorbidity with body mass index (BMI) of 45.0 to 49.9 in adult [facility]) on their problem list.       Current Outpatient Medications   Medication Instructions    diclofenac sodium (VOLTAREN) 4 g, Topical, 4 TIMES DAILY    sertraline (ZOLOFT) 100 mg,  Oral, DAILY           An electronic signature was used to authenticate this note.  -- Hermina Staggers, MD

## 2021-09-03 NOTE — Patient Instructions (Addendum)
Trigger point- Voltaren Gel, heat and massage. If not better in 3-4 weeks can return for trigger point injection.       ADHD Testing groups in the [facility] area:    [facility] [phone]   Cognitive Behavior Therapy & Testing [phone]   [facility] Psychological Resources [phone]   [facility]al Health [phone]   [facility] [phone]   [facility] [phone]   [facility] Neurology- Dr. Crecencio Mc [phone]

## 2021-09-03 NOTE — Progress Notes (Signed)
Chief Complaint   Patient presents with    Depression    Leg Injury     bruise     Pt in office for follow up depression.       Also c/o bruise on left leg. States she fell and her leg 'bruised up'. Is still in 'alot of pain'.              PHQ-9  09/03/2021   Little interest or pleasure in doing things 2   Feeling down, depressed, or hopeless 2   Trouble falling or staying asleep, or sleeping too much 3   Feeling tired or having little energy 3   Poor appetite or overeating 3   Feeling bad about yourself - or that you are a failure or have let yourself or your family down 1   Trouble concentrating on things, such as reading the newspaper or watching television 3   Moving or speaking so slowly that other people could have noticed. Or the opposite - being so fidgety or restless that you have been moving around a lot more than usual 3   Thoughts that you would be better off dead, or of hurting yourself in some way 0   PHQ-2 Score 4   PHQ-9 Total Score 20   If you checked off any problems, how difficult have these problems made it for you to do your work, take care of things at home, or get along with other people? 2   How difficult have these problems made it for you to do your work, take care of your home and get along with others -        .phq

## 2021-09-09 NOTE — Telephone Encounter (Signed)
Pt called to inquire on whether appt on 4/28 will be needed as she discussed with Dr.Irwin on her last visit about a more affective treatment plan. Please review and advise as soon as possible.

## 2021-09-10 ENCOUNTER — Ambulatory Visit: Payer: MEDICAID | Primary: Student in an Organized Health Care Education/Training Program

## 2021-09-10 ENCOUNTER — Encounter: Primary: Student in an Organized Health Care Education/Training Program

## 2021-10-08 ENCOUNTER — Encounter

## 2021-10-08 MED ORDER — SULFAMETHOXAZOLE-TRIMETHOPRIM 800-160 MG PO TABS
800-160 MG | ORAL_TABLET | Freq: Two times a day (BID) | ORAL | 0 refills | Status: AC
Start: 2021-10-08 — End: 2021-10-11

## 2021-10-08 NOTE — Progress Notes (Signed)
Pt with symptoms of dysuria and urinary frequency.  Called and requesting help.  My Friday schedule is full and its a 3 day weekend. Will send empiric antibiotics.  If worsening over the weekend would recommend urgent care.    Vivianne Master, MD

## 2021-10-10 DIAGNOSIS — N390 Urinary tract infection, site not specified: Secondary | ICD-10-CM

## 2021-10-10 NOTE — ED Provider Notes (Signed)
[facility] EMERGENCY DEPT  EMERGENCY DEPARTMENT HISTORY AND PHYSICAL EXAM      Date: 10/10/2021  Patient Name: Susan Ruiz  MRN: [id number]  Birthdate: [birth year]  Date of evaluation: 10/10/2021  Provider: Tyrell Antonio, MD   Note Started: 10:58 PM 10/10/21    HISTORY OF PRESENT ILLNESS     Chief Complaint   Patient presents with    Vaginal Discharge       History Provided By: Patient    HPI: Susan Ruiz, 21 y.o. female Patient with no significant past medical history  Presents with clear vaginal discharge with itching. It started 2 days ago. Her PCP placed her on Bactrim. She does not remember her LMP because she is on Depo. She denies other symptoms. Her symptoms are acute and mild.    HPI        PAST MEDICAL HISTORY   Past Medical History:  History reviewed. No pertinent past medical history.    Past Surgical History:  History reviewed. No pertinent surgical history.    Family History:  History reviewed. No pertinent family history.    Social History:  Social History     Tobacco Use    Smoking status: Never    Smokeless tobacco: Never   Vaping Use    Vaping Use: Never used   Substance Use Topics    Alcohol use: Never    Drug use: Never       Allergies:  No Known Allergies    PCP: Hermina Staggers, MD    Current Meds:   Discharge Medication List as of 10/10/2021 10:25 PM        CONTINUE these medications which have NOT CHANGED    Details   sulfamethoxazole-trimethoprim (BACTRIM DS;SEPTRA DS) 800-160 MG per tablet Take 1 tablet by mouth 2 times daily for 3 days, Disp-6 tablet, R-0Normal      sertraline (ZOLOFT) 100 MG tablet Take 1 tablet by mouth daily, Disp-90 tablet, R-1Normal      diclofenac sodium (VOLTAREN) 1 % GEL Apply 4 g topically 4 times daily, Topical, 4 TIMES DAILY Starting Fri 09/03/2021, Disp-150 g, R-1, Normal             REVIEW OF SYSTEMS   Review of Systems   Constitutional: Negative.    HENT: Negative.     Respiratory: Negative.     Cardiovascular: Negative.    Gastrointestinal: Negative.     Genitourinary:  Positive for vaginal discharge.   Musculoskeletal: Negative.    Neurological: Negative.    All other systems reviewed and are negative.  Positives and Pertinent negatives as per HPI.    PHYSICAL EXAM     Vitals:    10/10/21 2040 10/10/21 2236   BP: 135/84 129/79   Pulse: 84 73   Resp: 18 20   Temp: 97 F (36.1 C)    TempSrc: Oral    SpO2: 99% 97%   Weight: (!) 309 lb (140.2 kg)    Height: 5\' 7"  (1.702 m)       Physical Exam  Vitals and nursing note reviewed.   Constitutional:       Appearance: Normal appearance. She is obese.   HENT:      Head: Normocephalic.   Cardiovascular:      Rate and Rhythm: Normal rate and regular rhythm.   Pulmonary:      Effort: Pulmonary effort is normal.      Breath sounds: Normal breath sounds.   Abdominal:      Palpations: Abdomen  is soft.   Musculoskeletal:      Cervical back: Normal range of motion and neck supple.   Neurological:      General: No focal deficit present.      Mental Status: She is alert and oriented to person, place, and time.       SCREENINGS               No data recorded      LAB AND DIAGNOSTIC RESULTS   Labs:  Recent Results (from the past 12 hour(s))   Wet prep, genital    Collection Time: 10/10/21  8:49 PM    Specimen: Miscellaneous sample   Result Value Ref Range    Clue Cells, Wet Prep Negative      Trich, Wet Prep Negative     Urinalysis    Collection Time: 10/10/21  9:30 PM   Result Value Ref Range    Color, UA Yellow      Appearance Clear      Specific Gravity, UA 1.028 1.005 - 1.030      pH, Urine 6.0 5.0 - 8.0      Protein, UA Negative Negative mg/dL    Glucose, UA Negative Negative mg/dL    Ketones, Urine Negative Negative mg/dL    Bilirubin Urine Negative Negative      Blood, Urine Negative Negative      Urobilinogen, Urine 1.0 0.2 - 1.0 EU/dL    Nitrite, Urine Negative Negative      Leukocyte Esterase, Urine Moderate (A) Negative     Urine Preg (Lab)    Collection Time: 10/10/21  9:30 PM   Result Value Ref Range    HCG(Urine)  Pregnancy Test Negative Negative     Urinalysis, Micro    Collection Time: 10/10/21  9:30 PM   Result Value Ref Range    WBC, UA 5-10 0 - 4 /hpf    RBC, UA 0-5 0 - 2 /hpf    Epithelial Cells UA Moderate 0 - 20 /lpf    BACTERIA, URINE 1+ (A) None /hpf    Yeast, UA Present         Radiologic Studies:  Non-plain film images such as CT, Ultrasound and MRI are read by the radiologist. Plain radiographic images are visualized and preliminarily interpreted by the ED Provider with the below findings:        Interpretation per the Radiologist below, if available at the time of this note:  No results found.    PROCEDURES   Unless otherwise noted below, none.  Performed by: Tyrell Antonio, MD   Procedures        ED COURSE and DIFFERENTIAL DIAGNOSIS/MDM   Vitals:    Vitals:    10/10/21 2040 10/10/21 2236   BP: 135/84 129/79   Pulse: 84 73   Resp: 18 20   Temp: 97 F (36.1 C)    TempSrc: Oral    SpO2: 99% 97%   Weight: (!) 309 lb (140.2 kg)    Height: 5\' 7"  (1.702 m)          Patient was given the following medications:  Medications   cefTRIAXone (ROCEPHIN) 500 mg in lidocaine 1 % 1.43 mL IM Injection (500 mg IntraMUSCular Given 10/10/21 2232)   azithromycin (ZITHROMAX) tablet 1,000 mg (1,000 mg Oral Given 10/10/21 2231)       CONSULTS: (Who and What was discussed)  None    Chronic Conditions: None  Social Determinants affecting Dx or Tx: Not  applicable  Counseling: None    Records Reviewed (source and summary of external notes): Nursing Notes    ZOX:WRUEAVWDM:Patient with  no significant past medical history  Who presents with vaginal discharge. She is sexually active and uses condoms. DDx include, BV, STI, UTI. She was found to have a UTI with Yeast in urine. Trich and clue negative. She is to follow her GC Chlamydia with her PCP and continue Bactrim. She was given empiric STI coverage with Rocephin 500 mg IM and Zithromax 1000 mg PO.  She was discharged on Monistat Vaginal cream and a Diflucan 150 mg PO Pill by prescription.            FINAL IMPRESSION     1. Urinary tract infection without hematuria, site unspecified    2. Candidiasis of female genitalia          DISPOSITION/PLAN   DISPOSITION Decision To Discharge 10/10/2021 10:12:55 PM    Discharge Note: The patient is stable for discharge home. the signs, symptom, diagnosis and discharge instructions have been discussed, understanding conveyed and agreed upon. The patient is to follow up as recommended or return to ED should their symptoms worsen.         PATIENT REFERRED TO:  Hermina StaggersJeffrey Irwin, MD  [id number]  [address]  [place] [id number]  [phone]    In 1 week           DISCHARGE MEDICATIONS:     Medication List        START taking these medications      fluconazole 150 MG tablet  Commonly known as: DIFLUCAN  Take 1 tablet by mouth once for 1 dose     miconazole 2 % vaginal cream  Commonly known as: Monistat 7 Simply Cure  Place vaginally nightly for 7 days.            ASK your doctor about these medications      diclofenac sodium 1 % Gel  Commonly known as: VOLTAREN  Apply 4 g topically 4 times daily     sertraline 100 MG tablet  Commonly known as: ZOLOFT  Take 1 tablet by mouth daily     sulfamethoxazole-trimethoprim 800-160 MG per tablet  Commonly known as: BACTRIM DS;SEPTRA DS  Take 1 tablet by mouth 2 times daily for 3 days               Where to Get Your Medications        These medications were sent to [id number] [place] [place]- [place], [place] - [address] - P [phone] - F [phone]  [address], [place] [id number]      Phone: [phone]   fluconazole 150 MG tablet  miconazole 2 % vaginal cream           DISCONTINUED MEDICATIONS:  Discharge Medication List as of 10/10/2021 10:25 PM          I am the Primary Clinician of Record: Tyrell AntonioEniola Lavren Lewan, MD (electronically signed)    (Please note that parts of this dictation were completed with voice recognition software. Quite often unanticipated grammatical, syntax, homophones, and other interpretive  errors are inadvertently transcribed by the computer software. Please disregards these errors. Please excuse any errors that have escaped final proofreading.)     Ninetta LightsEniola Adebayo Merilynn Haydu, MD  10/10/21 2258

## 2021-10-10 NOTE — Discharge Instructions (Signed)
Continue your Bactrim prescription

## 2021-10-10 NOTE — ED Triage Notes (Signed)
Pt states she is having white vaginal discharge x2 days. Pt states she called her Dr on Friday who started her on bactrim but symptoms have not gotten any better.

## 2021-10-11 ENCOUNTER — Inpatient Hospital Stay
Admit: 2021-10-11 | Discharge: 2021-10-11 | Disposition: A | Discharge status: Home / Self Care | Payer: MEDICAID | Attending: Emergency Medicine

## 2021-10-11 LAB — URINALYSIS, MICRO

## 2021-10-11 LAB — PREGNANCY, URINE: Pregnancy, Urine: NEGATIVE

## 2021-10-11 LAB — URINALYSIS
Bilirubin Urine: NEGATIVE
Blood, Urine: NEGATIVE
Glucose, UA: NEGATIVE mg/dL
Ketones, Urine: NEGATIVE mg/dL
Nitrite, Urine: NEGATIVE
Protein, UA: NEGATIVE mg/dL
Specific Gravity, UA: 1.028 (ref 1.005–1.030)
Urobilinogen, Urine: 1 EU/dL (ref 0.2–1.0)
pH, Urine: 6 (ref 5.0–8.0)

## 2021-10-11 LAB — WET PREP, GENITAL
Clue Cells, Wet Prep: NEGATIVE
Trich, Wet Prep: NEGATIVE

## 2021-10-11 MED ORDER — MICONAZOLE NITRATE 2 % VA CREA
2 % | VAGINAL | 1 refills | Status: AC
Start: 2021-10-11 — End: 2021-10-18

## 2021-10-11 MED ORDER — CEFTRIAXONE SODIUM 1 G IJ SOLR
1 g | INTRAMUSCULAR | Status: AC
Start: 2021-10-11 — End: 2021-10-10
  Administered 2021-10-11: 03:00:00 500 mg via INTRAMUSCULAR

## 2021-10-11 MED ORDER — FLUCONAZOLE 150 MG PO TABS
150 MG | ORAL_TABLET | Freq: Once | ORAL | 0 refills | Status: AC
Start: 2021-10-11 — End: 2021-10-10

## 2021-10-11 MED ORDER — MICONAZOLE NITRATE 2 % VA CREA
2 % | VAGINAL | 1 refills | Status: DC
Start: 2021-10-11 — End: 2021-10-11

## 2021-10-11 MED ORDER — AZITHROMYCIN 250 MG PO TABS
250 MG | ORAL | Status: AC
Start: 2021-10-11 — End: 2021-10-10
  Administered 2021-10-11: 03:00:00 1000 mg via ORAL

## 2021-10-11 MED FILL — CEFTRIAXONE SODIUM 1 G IJ SOLR: 1 g | INTRAMUSCULAR | Qty: 1000

## 2021-10-11 MED FILL — AZITHROMYCIN 250 MG PO TABS: 250 MG | ORAL | Qty: 4

## 2021-10-14 ENCOUNTER — Ambulatory Visit
Payer: MEDICAID | Attending: Student in an Organized Health Care Education/Training Program | Primary: Student in an Organized Health Care Education/Training Program

## 2021-11-11 ENCOUNTER — Ambulatory Visit
Payer: MEDICAID | Attending: Student in an Organized Health Care Education/Training Program | Primary: Student in an Organized Health Care Education/Training Program

## 2021-11-15 IMAGING — CT CT ABD-PELV W/ CM
2 of 4 series · 16 of 46 positions shown, 18 images · IV contrast (omnipaque)
Comparison: Radiograph yesterday.

CLINICAL DATA: Acute nonlocalized abdominal pain. Nausea and
vomiting.

EXAM:
CT ABDOMEN AND PELVIS WITH CONTRAST
TECHNIQUE: Multidetector CT imaging of the abdomen and pelvis was performed
using the standard protocol following bolus administration of
intravenous contrast.
CONTRAST:  100mL OMNIPAQUE IOHEXOL 300 MG/ML  SOLN

[Series 3: abdomen/pel 5.0 · axial · 0.96mm/px · z∈[-123,+272]mm · 13 of 89 slices shown, 15 images]
[im 5/89  soft-tissue]
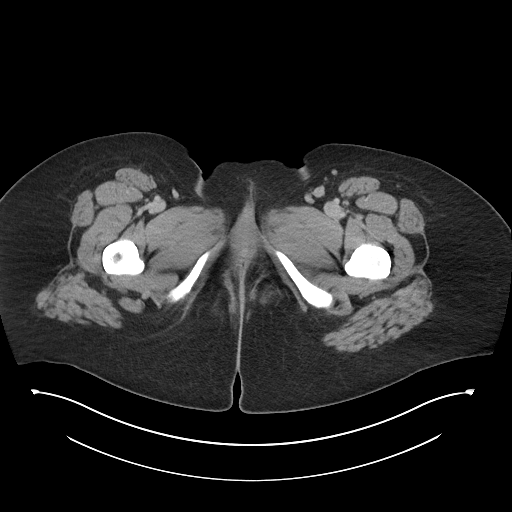
[im 5/89  bone]
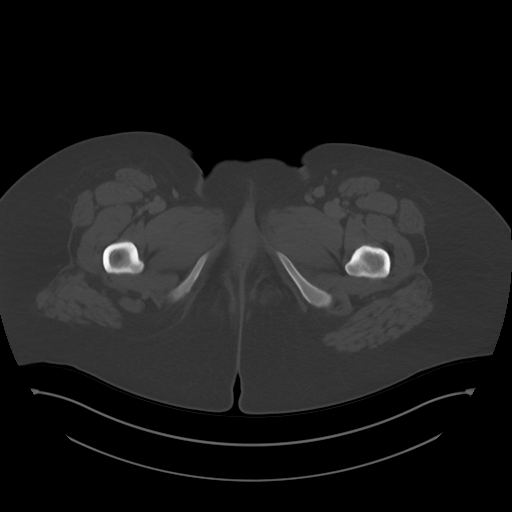
[im 14/89  soft-tissue]
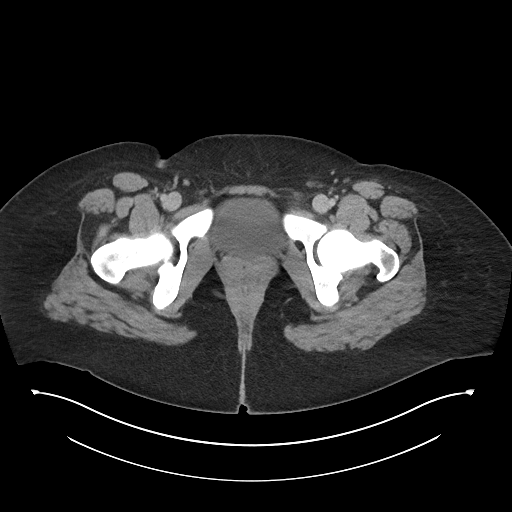
[im 18/89  soft-tissue]
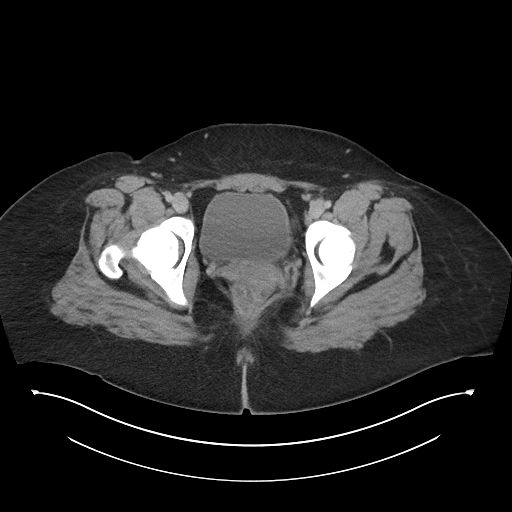
[im 27/89  soft-tissue]
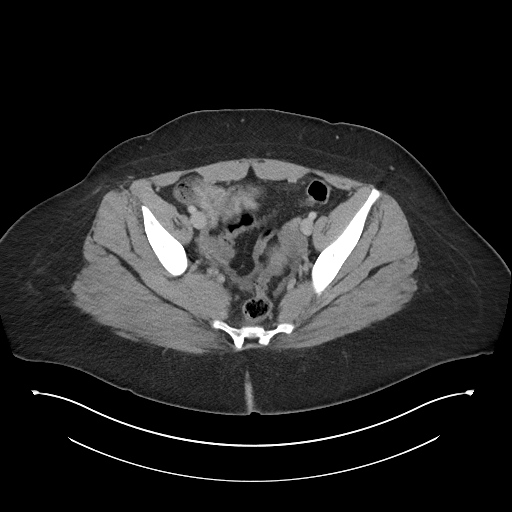
[im 31/89  soft-tissue]
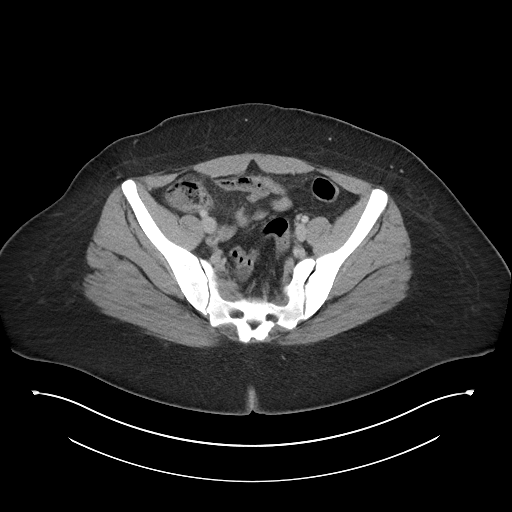
[im 40/89  soft-tissue]
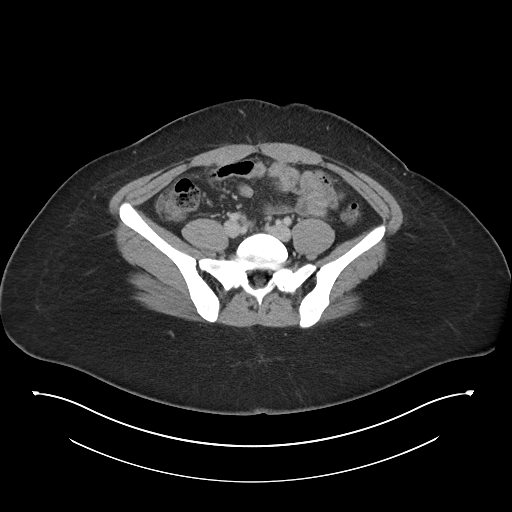
[im 45/89  soft-tissue]
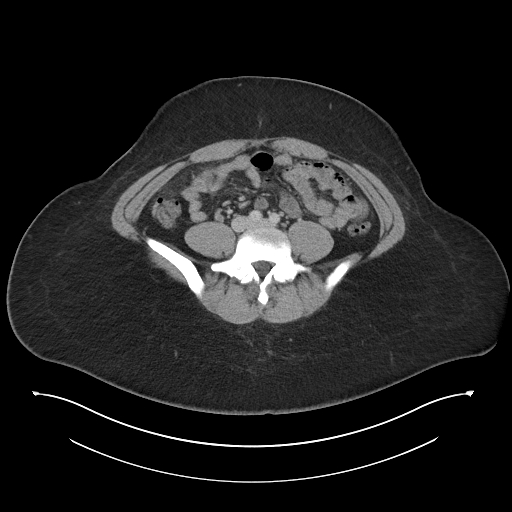
[im 49/89  soft-tissue]
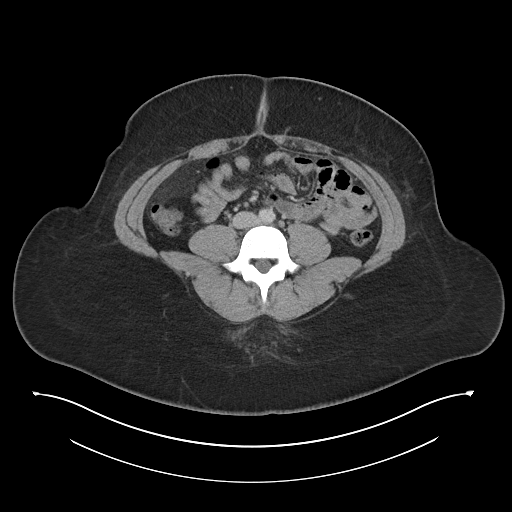
[im 58/89  soft-tissue]
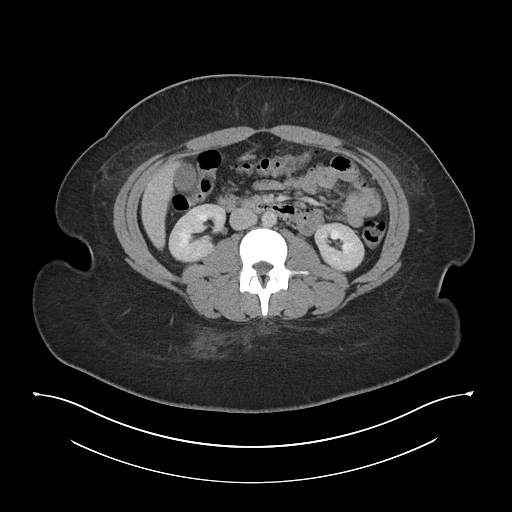
[im 58/89  bone]
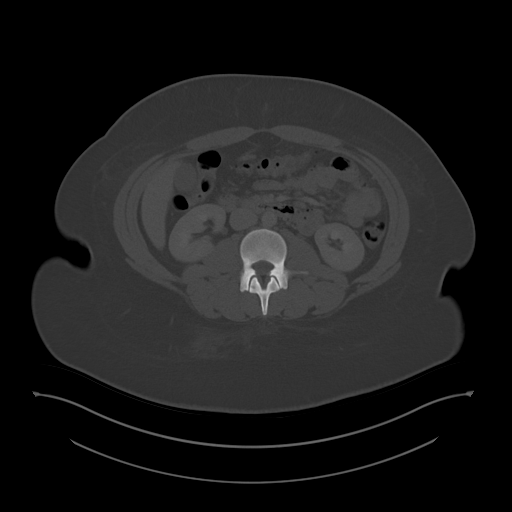
[im 62/89  soft-tissue]
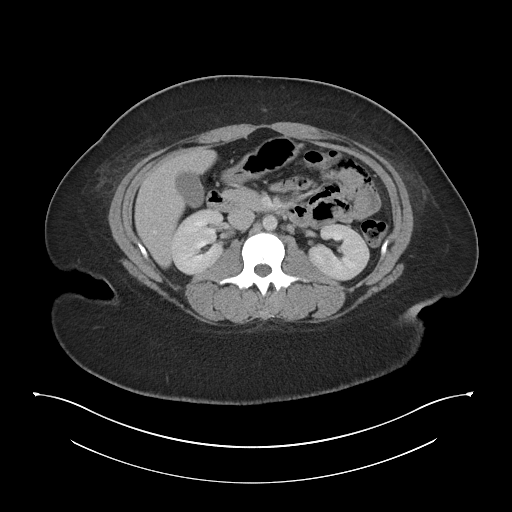
[im 71/89  soft-tissue]
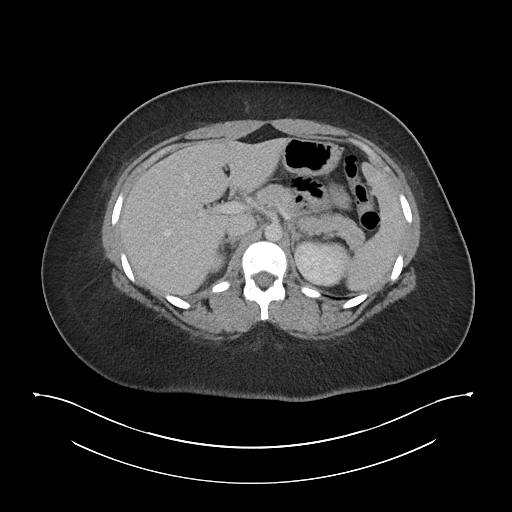
[im 75/89  soft-tissue]
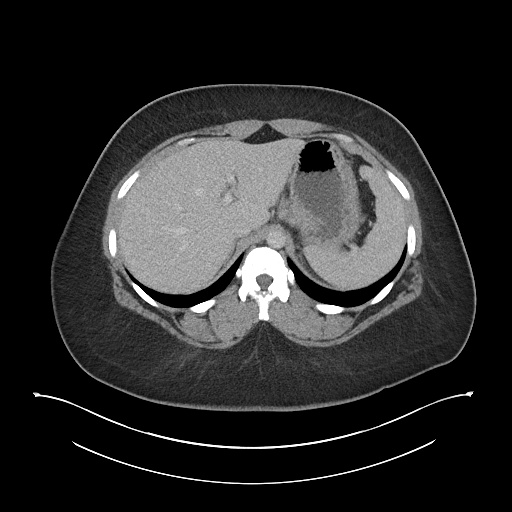
[im 84/89  soft-tissue]
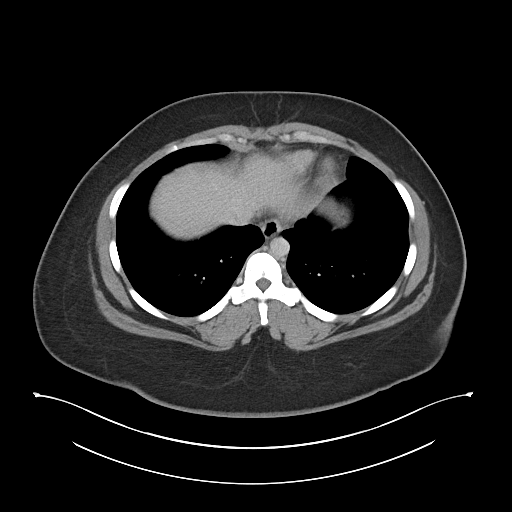

[Series 6: abdomen/pel 3.0 mpr cor · coronal · 0.85mm/px · 3 of 84 slices shown]
[im 28/84  soft-tissue]
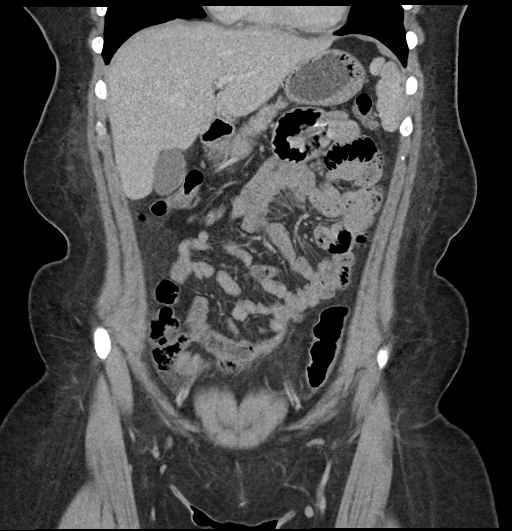
[im 37/84  soft-tissue]
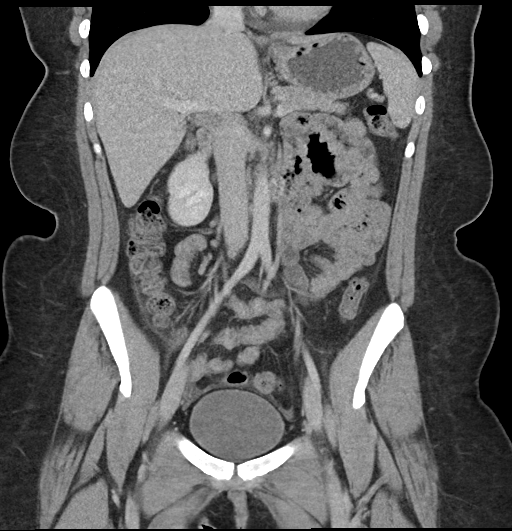
[im 47/84  soft-tissue]
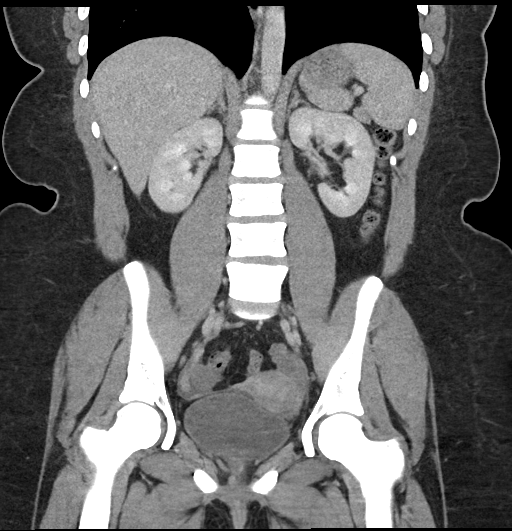

[16 of 46 positions shown; findings below may reference images not displayed]

FINDINGS: Lower chest: The lung bases are clear.

Hepatobiliary: Focal fatty infiltration adjacent to the falciform
ligament. Liver is otherwise unremarkable. Gallbladder
physiologically distended, no calcified stone. No biliary
dilatation.

Pancreas: No ductal dilatation or inflammation.

Spleen: Normal in size without focal abnormality.

Adrenals/Urinary Tract: Normal adrenal glands. No hydronephrosis or
perinephric edema. Homogeneous renal enhancement. Urinary bladder is
physiologically distended without wall thickening.

Stomach/Bowel: Stomach is unremarkable. There is no small bowel
obstruction or inflammatory change. Normal appendix, for example
series 6, image 39. Fat stranding in the right lower quadrant does
not appear centered on the colon, and there is no colonic wall
thickening. Small volume of colonic stool.

Vascular/Lymphatic: Normal caliber abdominal aorta. Portal vein is
patent. There are a few prominent central mesenteric nodes that are
not enlarged by size criteria.

Reproductive: Nabothian cyst in the cervix, uterus is otherwise
unremarkable. Ovaries are symmetric in size without focal
abnormality. There is complex free fluid in the pelvis and right
adnexa.

Other: Mild nonspecific fat stranding involving the right lower
quadrant omental fat, not centered on the colon or the appendix.
There is mild reticulation of the right adnexal fat, adjacent to the
ovary. Small volume complex free fluid in the pelvis. No free air or
focal abscess.

Musculoskeletal: There are no acute or suspicious osseous
abnormalities.
IMPRESSION: Mild nonspecific fat stranding involving the right lower quadrant
omental fat, not centered on the colon, bowel, or the appendix.
There is also mild reticulation of the right adnexal fat adjacent to
the ovary, with a small volume of complex free fluid in the pelvis.
Findings may be due to recent cyst rupture, however pelvic
inflammatory disease is also considered. Possibility of omental
infarct is raised, but felt less likely.

No other acute findings or explanation for pain.

## 2021-12-03 ENCOUNTER — Ambulatory Visit
Payer: MEDICAID | Attending: Student in an Organized Health Care Education/Training Program | Primary: Student in an Organized Health Care Education/Training Program

## 2021-12-05 ENCOUNTER — Inpatient Hospital Stay
Admit: 2021-12-05 | Discharge: 2021-12-05 | Disposition: A | Discharge status: Home / Self Care | Payer: MEDICAID | Attending: Family Medicine

## 2021-12-05 DIAGNOSIS — J069 Acute upper respiratory infection, unspecified: Secondary | ICD-10-CM

## 2021-12-05 NOTE — ED Provider Notes (Signed)
[facility] EMERGENCY DEPT  EMERGENCY DEPARTMENT ENCOUNTER      Pt Name: Susan Ruiz  MRN: [id number]  Birthdate 04/13/2001  Date of evaluation: 12/05/2021  Provider: Juventino Slovak, DO    CHIEF COMPLAINT       Chief Complaint   Patient presents with    URI         HISTORY OF PRESENT ILLNESS   (Location/Symptom, Timing/Onset, Context/Setting, Quality, Duration, Modifying Factors, Severity)  Note limiting factors.   Susan Ruiz is a 21 y.o. female who presents to the emergency department patient comes in stating she has been having congestion in her nose for 2 days she has used some over-the-counter DayQuil.  Coughing is worse at nighttime does not work with anybody does not know anybody who is sick immunizations up-to-date she has not taken anything for today.  She does smoke.  Patient states that her ears are itchy but her eyes are not    HPI    Nursing Notes were reviewed.    REVIEW OF SYSTEMS    (2-9 systems for level 4, 10 or more for level 5)     Review of Systems   Constitutional:  Negative for fever.   HENT:  Positive for congestion.    Respiratory:  Positive for cough.    All other systems reviewed and are negative.    Except as noted above the remainder of the review of systems was reviewed and negative.       PAST MEDICAL HISTORY   History reviewed. No pertinent past medical history.      SURGICAL HISTORY     History reviewed. No pertinent surgical history.      CURRENT MEDICATIONS       Previous Medications    SERTRALINE (ZOLOFT) 100 MG TABLET    Take 1 tablet by mouth daily       ALLERGIES     Patient has no known allergies.    FAMILY HISTORY     History reviewed. No pertinent family history.       SOCIAL HISTORY       Social History     Socioeconomic History    Marital status: Single     Spouse name: None    Number of children: None    Years of education: None    Highest education level: None   Tobacco Use    Smoking status: Every Day     Packs/day: 0.25     Types: Cigarettes    Smokeless tobacco:  Never   Vaping Use    Vaping Use: Never used   Substance and Sexual Activity    Alcohol use: Never    Drug use: Never    Sexual activity: Yes     Partners: Male     Birth control/protection: Injection     Social Determinants of Health     Financial Resource Strain: Unknown    Difficulty of Paying Living Expenses: Patient refused   Food Insecurity: Unknown    Worried About Programme researcher, broadcasting/film/video in the Last Year: Patient refused    Barista in the Last Year: Patient refused   Transportation Needs: Unknown    Lack of Transportation (Non-Medical): Patient refused   Housing Stability: Unknown    Unstable Housing in the Last Year: Patient refused       SCREENINGS         Glasgow Coma Scale  Eye Opening: Spontaneous  Best Verbal Response: Oriented  Best Motor  Response: Obeys commands  Glasgow Coma Scale Score: 15                     CIWA Assessment  BP: 122/88  Pulse: 79                 PHYSICAL EXAM    (up to 7 for level 4, 8 or more for level 5)     ED Triage Vitals [12/05/21 1416]   BP Temp Temp Source Pulse Respirations SpO2 Height Weight - Scale   122/88 98.6 F (37 C) Oral 79 16 99 % 5' 6.5" (1.689 m) 278 lb (126.1 kg)       Physical Exam  Vitals and nursing note reviewed.   Constitutional:       Appearance: Normal appearance. She is obese.   HENT:      Head: Normocephalic and atraumatic.      Right Ear: Tympanic membrane, ear canal and external ear normal.      Left Ear: Tympanic membrane, ear canal and external ear normal.      Nose:      Comments: Nasal turbinates are erythematous some mucus noted     Mouth/Throat:      Pharynx: No oropharyngeal exudate.      Comments: Postnasal drip appreciated  Eyes:      Conjunctiva/sclera: Conjunctivae normal.   Cardiovascular:      Rate and Rhythm: Normal rate.   Pulmonary:      Effort: Pulmonary effort is normal. No respiratory distress.      Breath sounds: Normal breath sounds. No wheezing.   Musculoskeletal:         General: Normal range of motion.   Lymphadenopathy:       Cervical: No cervical adenopathy.   Skin:     General: Skin is warm and dry.      Capillary Refill: Capillary refill takes less than 2 seconds.   Neurological:      General: No focal deficit present.      Mental Status: She is alert.   Psychiatric:         Mood and Affect: Mood normal.         Behavior: Behavior normal.       DIAGNOSTIC RESULTS     EKG: All EKG's are interpreted by the Emergency Department Physician who either signs or Co-signs this chart in the absence of a cardiologist.        RADIOLOGY:   Non-plain film images such as CT, Ultrasound and MRI are read by the radiologist. Plain radiographic images are visualized and preliminarily interpreted by the emergency physician with the below findings:      Interpretation per the Radiologist below, if available at the time of this note:    No orders to display         ED BEDSIDE ULTRASOUND:   Performed by ED Physician - none    LABS:  Labs Reviewed - No data to display    All other labs were within normal range or not returned as of this dictation.    EMERGENCY DEPARTMENT COURSE and DIFFERENTIAL DIAGNOSIS/MDM:   Vitals:    Vitals:    12/05/21 1416   BP: 122/88   Pulse: 79   Resp: 16   Temp: 98.6 F (37 C)   TempSrc: Oral   SpO2: 99%   Weight: 278 lb (126.1 kg)   Height: 5' 6.5" (1.689 m)  Medical Decision Making  Viral upper respiratory infection at this time, had a discussion with the patient using low-dose Sudafed to help dry her out using Delsym, for coughing.  Does not need any antibiotics at this time as this is only day 2.  Have her follow-up with her primary care doctor I did explain to her that this could take anywhere from 2 days to 2 weeks to improve            REASSESSMENT            CONSULTS:  None    PROCEDURES:  Unless otherwise noted below, none     Procedures        FINAL IMPRESSION      1. Acute upper respiratory infection          DISPOSITION/PLAN   DISPOSITION Decision To Discharge 12/05/2021 02:22:58 PM      PATIENT REFERRED  [place]  [id number] [facility]  [address]  [place] [address]  [phone]    Schedule an appointment as soon as possible for a visit         DISCHARGE MEDICATIONS:  New Prescriptions    No medications on file     Controlled Substances Monitoring:     No flowsheet data found.    (Please note that portions of this note were completed with a voice recognition program.  Efforts were made to edit the dictations but occasionally words are mis-transcribed.)    Juventino Slovak, DO (electronically signed)  Attending Emergency Physician           Juventino Slovak, DO  12/05/21 1426

## 2021-12-05 NOTE — Discharge Instructions (Addendum)
As we spoke at high suspicion this is more of a viral upper respiratory infection which can last anywhere from 2 days to 2 weeks.  Recommendations are to use some over-the-counter Advil Cold and Sinus which is available from the pharmacist by showing your driver's license.  This has low-dose Sudafed to help with drying you out.  Avoid Mucinex or guaifenesin as this can make your cough worse.  I do recommend Delsym cough medication.  Follow-up with your primary care doctor and I recommend quitting smoking as well.  Return to the emergency department there is any questions or concerns

## 2021-12-05 NOTE — ED Triage Notes (Signed)
Pt states her ears have been itchy and ringy for the past 2 days, pt has a cough and her throat is sore  Pt took dayquil yesterday, but states she felt worse after.

## 2021-12-06 ENCOUNTER — Ambulatory Visit
Admit: 2021-12-06 | Payer: MEDICAID | Attending: Student in an Organized Health Care Education/Training Program | Primary: Student in an Organized Health Care Education/Training Program

## 2021-12-06 DIAGNOSIS — J069 Acute upper respiratory infection, unspecified: Secondary | ICD-10-CM

## 2021-12-06 MED ORDER — AMOXICILLIN-POT CLAVULANATE 875-125 MG PO TABS
875-125 MG | ORAL_TABLET | Freq: Two times a day (BID) | ORAL | 0 refills | Status: AC
Start: 2021-12-06 — End: 2021-12-15

## 2021-12-06 MED ORDER — FLUCONAZOLE 150 MG PO TABS
150 MG | ORAL_TABLET | ORAL | 0 refills | Status: AC
Start: 2021-12-06 — End: 2021-12-12

## 2021-12-06 NOTE — Progress Notes (Signed)
Susan Ruiz (DOB: May 27, 2000) is a 21 y.o. female, established patient, here for evaluation of the following chief complaint(s):  URI (Pt states she went to the ER and was instructed to take OTC meds. Pt states she has tried everything and seems like it made it worst. Pt states she has been coughing up mucus and it had blood in it. Pt states this has been going on for 3 days. No fever now, was running a light fever.)       Assessment/Plan:  1. Upper respiratory tract infection, unspecified type  Discussed difference between viral and bacterial infections.  Likely still viral infection, will let time course of symptoms guide antibiotic use.  Prescription sent for Augmentin times for 2 days.  If symptoms improving at that point, would not fill.  If symptoms worsening or the same, can take for suspected bacterial infection.  Continue over-the-counter conservative therapies.  -COVID and strep testing done, negative.  - amoxicillin-clavulanate (AUGMENTIN) 875-125 MG per tablet; Take 1 tablet by mouth 2 times daily for 7 days  Dispense: 14 tablet; Refill: 0    2. Yeast infection  Recurrent.  We will try 2 doses of Diflucan.  Discussed and printed off information for interventions to help prevent future infections.  - fluconazole (DIFLUCAN) 150 MG tablet; Take 1 tablet by mouth every 72 hours for 6 days  Dispense: 2 tablet; Refill: 0      Return in about 4 days (around 12/10/2021) for routine check up.      Subjective/Objective:  HPI    Tooth infection: upcoming removal next month.       Upper Respiratory Infection-     Day of illness:  []  1-2 days [x]  3-4 days []  5-6 days []  7-10 days [] >10 days    Symptoms present:   []  Chills  []  Cough-dry [x]  Cough- prod. []  Diarrhea   []  Ear pressure []  Emesis []  Fever []  Headache   []  LAD []  Nausea  []  Rash [x]  Sore throat   [x]  Sinus cong. []  Vomiting       Previous testing:  [x]  Covid neg  []  Covid pos []  Flu pos []  Flu neg []  Strep pos [x]  Strep neg    Meds attempted:  Theraflu        Physical Exam  Blood pressure 122/88, pulse 79, temperature 97.5 F (36.4 C), temperature source Tympanic, resp. rate 16, weight 300 lb (136.1 kg), SpO2 97 %. Body mass index is 47.7 kg/m.  General appearance - Alert, NAD, normal appearance  Head - Atraumatic. Normocephalic. No lymphadenopathy  Eyes - EOMI. Sclera white.   Ears - Hearing grossly normal. TMs without erythema or bulge bilaterally. No cerumen impaction.  Erythematous left ear canal where she is used frequent Q-tips.  Nose - Nares patent, congestion present  Throat - moist, pharynx without erythema or exudates.   Respiratory - LCTAB. Effort normal, without respiratory distress. No wheeze/rale/rhonchi  Heart - Normal rate, regular rhythm. No m/r/r  Abdomen - Soft, non tender. Non distended.   Neurological - No gross focal deficits. Speech normal. Alert and oriented. Mental status is at baseline.   Musculoskeletal - Grossly normal ROM, Gait normal.    Extremities - No LE edema.   Skin - normal coloration and normal turgor. No cyanosis, no rash.    Medical History- Reviewed Social History- Reviewed Surgical History- Reviewed   Susan Ruiz has no past medical history on file. Susan Ruiz reports that she has been smoking cigarettes. She has been smoking  an average of .25 packs per day. She has never used smokeless tobacco. She reports that she does not drink alcohol and does not use drugs. Susan Ruiz has no past surgical history on file.         Problem List- Reviewed   Susan Ruiz has Moderate episode of recurrent major depressive disorder (HCC); Recurrent UTI; and Class 3 severe obesity due to excess calories without serious comorbidity with body mass index (BMI) of 45.0 to 49.9 in adult [facility]) on their problem list.       Current Outpatient Medications   Medication Instructions    [START ON 12/08/2021] amoxicillin-clavulanate (AUGMENTIN) 875-125 MG per tablet 1 tablet, Oral, 2 TIMES DAILY    fluconazole (DIFLUCAN) 150 mg, Oral, EVERY 72 HOURS    sertraline  (ZOLOFT) 100 mg, Oral, DAILY           An electronic signature was used to authenticate this note.  -- Hermina Staggers, MD

## 2021-12-06 NOTE — Progress Notes (Signed)
Chief Complaint   Patient presents with    URI     Pt states she went to the ER and was instructed to take OTC meds. Pt states she has tried everything and seems like it made it worst. Pt states she has been coughing up mucus and it had blood in it. Pt states this has been going on for 3 days. No fever now, was running a light fever.       1. "Have you been to the ER, urgent care clinic since your last visit?  Hospitalized since your last visit?" Yes Reason for visit: URI    2. "Have you seen or consulted any other health care providers outside of the [facility] since your last visit?" No     3. For patients aged 46-75: Has the patient had a colonoscopy / FIT/ Cologuard? NA - based on age      If the patient is female:    4. For patients aged 49-74: Has the patient had a mammogram within the past 2 years? NA - based on age or sex      16. For patients aged 21-65: Has the patient had a pap smear? Yes - no Care Gap present

## 2021-12-10 ENCOUNTER — Ambulatory Visit
Payer: MEDICAID | Attending: Student in an Organized Health Care Education/Training Program | Primary: Student in an Organized Health Care Education/Training Program

## 2022-03-30 ENCOUNTER — Ambulatory Visit
Admit: 2022-03-30 | Payer: MEDICAID | Attending: Student in an Organized Health Care Education/Training Program | Primary: Student in an Organized Health Care Education/Training Program

## 2022-03-30 DIAGNOSIS — Z9189 Other specified personal risk factors, not elsewhere classified: Secondary | ICD-10-CM

## 2022-03-30 MED ORDER — SERTRALINE HCL 100 MG PO TABS
100 MG | ORAL_TABLET | Freq: Every day | ORAL | 3 refills | Status: DC
Start: 2022-03-30 — End: 2022-05-24

## 2022-03-30 NOTE — Patient Instructions (Signed)
LARC (Long acting reversible contraception-  - The one I think would be best for your periods and avoiding weight gain would be an IUD. Specifically the Mirena. Please look more at this option and let me know if you like to pursue this.

## 2022-03-30 NOTE — Progress Notes (Signed)
Susan Ruiz (DOB: 2000/09/16) is a 21 y.o. female, established patient, here for evaluation of the following chief complaint(s):  Check-Up (Pt would like to get checked for STD's and pt has some concerns about her having a cycle twice in one month and bleeding very heavy with it. Pt needing a refill of her medication. Pt states she has been feeling much better since she been on the medication. Pt states she has started having really bad headaches but the headaches have subsided for a few days.)       Assessment/Plan:  1. At risk for sexually transmitted disease due to unprotected sex  Asymptomatic screening following at risk exposure.  - Chlamydia, Gonorrhea, Trichomoniasis; Future  - HIV 1/2 Ag/Ab, 4TH Generation,W Rflx Confirm; Future  - RPR W/Reflex Titer and Treponema Abs; Future  - HCG Qualitative, Serum; Future    2. Dysmenorrhea  Discussed treatment options of this.  Patient prefers to avoid any medications that may contribute to weight gain.  Discussed potential IUD.  LARC (Long acting reversible contraception-  - The one I think would be best for your periods and avoiding weight gain would be an IUD. Specifically the Mirena. Please look more at this option and let me know if you like to pursue this.     3. Major depressive disorder, recurrent, moderate (HCC)  Great improvement of mood on Zoloft 100 mg daily.  No significant side effects noted.  Would recommend continuing the next 6-12 months.  Refill sent to pharmacy.  - sertraline (ZOLOFT) 100 MG tablet; Take 1 tablet by mouth daily  Dispense: 90 tablet; Refill: 3      Return in about 6 months (around 09/28/2022) for routine check up, or sooner if you would like to use an IUD.      Subjective/Objective:  HPI      At risk sex  - Condom slipped with new female partner.   - No change in vaginal discharge or odor  - Did have more frequent periods again. Had cycle every 2 weeks.   -Was on Depo previously but gained weight.  Would like to avoid any  hormones that contribute to this.    MDD  -Has been taking Zoloft 100 mg daily.  Feels much better.  "I feel like a new person".        Physical Exam  Blood pressure 120/86, pulse 66, temperature 97.4 F (36.3 C), temperature source Tympanic, resp. rate 16, weight (!) 137 kg (302 lb), last menstrual period 03/28/2022, SpO2 98 %. Body mass index is 48.01 kg/m.  General appearance - Alert, NAD, normal appearance.   Head - Atraumatic. Normocephalic.   Eyes - EOMI. Sclera white.   Respiratory - Pulmonary effort is normal. No respiratory distress.   Neurological - No gross focal deficits. Speech normal.   Psychiatric - Mood normal. Behavior normal.     Medical History- Reviewed Social History- Reviewed Surgical History- Reviewed   Tacoya has no past medical history on file. Mariela reports that she has been smoking cigarettes. She has been smoking an average of .25 packs per day. She has never used smokeless tobacco. She reports that she does not drink alcohol and does not use drugs. Navada has no past surgical history on file.         Problem List- Reviewed   Zoee has Moderate episode of recurrent major depressive disorder (HCC); Recurrent UTI; and Class 3 severe obesity due to excess calories without serious comorbidity with body mass index (BMI)  of 45.0 to 49.9 in adult [facility]) on their problem list.       Current Outpatient Medications   Medication Instructions    sertraline (ZOLOFT) 100 mg, Oral, DAILY           Note to patient: The purpose of this note is to communicate with the other providers involved in your care. It is written using standard medical terminology. The voice recognition software Dragon was used and quite often unanticipated grammatical, syntax, homophones, and other interpretive errors are inadvertently transcribed. Please disregard these and excuse any that have escaped final proofreading. If you have questions regarding details of the note please call my office at [phone] and make an  appointment to discuss your concerns.    An electronic signature was used to authenticate this note.  -- Vivianne Master, MD

## 2022-03-30 NOTE — Progress Notes (Signed)
Chief Complaint   Patient presents with    Check-Up     Pt would like to get checked for STD's and pt has some concerns about her having a cycle twice in one month and bleeding very heavy with it. Pt needing a refill of her medication. Pt states she has been feeling much better since she been on the medication. Pt states she has started having really bad headaches but the headaches have subsided for a few days.       1. "Have you been to the ER, urgent care clinic since your last visit?  Hospitalized since your last visit?" No    2. "Have you seen or consulted any other health care providers outside of the [place] since your last visit?" No     3. For patients aged 39-75: Has the patient had a colonoscopy / FIT/ Cologuard? NA - based on age      If the patient is female:    4. For patients aged 75-74: Has the patient had a mammogram within the past 2 years? NA - based on age or sex      47. For patients aged 21-65: Has the patient had a pap smear? Yes - no Care Gap present

## 2022-03-31 ENCOUNTER — Inpatient Hospital Stay: Admit: 2022-03-31 | Payer: MEDICAID | Primary: Student in an Organized Health Care Education/Training Program

## 2022-03-31 LAB — HCG, SERUM, QUALITATIVE: HCG, Ql.: NEGATIVE

## 2022-04-01 LAB — RPR W/REFLEX TITER AND TREPONEMA ABS: RPR: NONREACTIVE

## 2022-04-01 LAB — CHLAMYDIA, GONORRHEA, TRICHOMONIASIS
Chlamydia trachomatis, NAA: NEGATIVE
Neisseria Gonorrhoeae, NAA: NEGATIVE
Trichomonas Vaginalis by NAA: NEGATIVE

## 2022-04-01 LAB — HIV 1/2 AG/AB, 4TH GENERATION,W RFLX CONFIRM: HIV 1/2 Interp: NONREACTIVE

## 2022-05-12 ENCOUNTER — Inpatient Hospital Stay
Admit: 2022-05-13 | Payer: PRIVATE HEALTH INSURANCE | Primary: Student in an Organized Health Care Education/Training Program

## 2022-05-12 ENCOUNTER — Ambulatory Visit
Admit: 2022-05-12 | Payer: PRIVATE HEALTH INSURANCE | Attending: Student in an Organized Health Care Education/Training Program | Primary: Student in an Organized Health Care Education/Training Program

## 2022-05-12 DIAGNOSIS — N3001 Acute cystitis with hematuria: Secondary | ICD-10-CM

## 2022-05-12 LAB — AMB POC URINALYSIS DIP STICK AUTO W/O MICRO
Bilirubin, Urine, POC: NEGATIVE
Glucose, Urine, POC: NEGATIVE
Ketones, Urine, POC: NEGATIVE
Nitrite, Urine, POC: POSITIVE
Specific Gravity, Urine, POC: 1.025 (ref 1.001–1.035)
Urobilinogen, POC: 0.2
pH, Urine, POC: 6.5 (ref 4.6–8.0)

## 2022-05-12 LAB — AMB POC URINE PREGNANCY TEST, VISUAL COLOR COMPARISON: HCG, Pregnancy, Urine, POC: NEGATIVE

## 2022-05-12 MED ORDER — NITROFURANTOIN MONOHYD MACRO 100 MG PO CAPS
100 MG | ORAL_CAPSULE | Freq: Two times a day (BID) | ORAL | 0 refills | Status: AC
Start: 2022-05-12 — End: 2022-05-22

## 2022-05-12 NOTE — Progress Notes (Signed)
Chief Complaint   Patient presents with    Urinary Frequency    Urinary Pain     X 2 weeks     "Have you been to the ER, urgent care clinic since your last visit?  Hospitalized since your last visit?"    NO    "Have you seen or consulted any other health care providers outside of [facility] since your last visit?"    NO

## 2022-05-12 NOTE — Progress Notes (Signed)
Susan Ruiz (DOB: 01/18/2001) is a 21 y.o. female, established patient, here for evaluation of the following chief complaint(s):  Urinary Frequency, Urinary Pain (X 2 weeks), and Amenorrhea       Assessment/Plan:  1. Acute cystitis with hematuria  Nitrite and blood positive. Treat with macrobid. Will send for culture for speciation and resistance patterns. Copious hydration  - Culture, Urine; Future  - nitrofurantoin, macrocrystal-monohydrate, (MACROBID) 100 MG capsule; Take 1 capsule by mouth 2 times daily for 10 days  Dispense: 20 capsule; Refill: 0    2. Pain passing urine  - AMB POC URINALYSIS DIP STICK AUTO W/O MICRO    3. Urinary frequency  - AMB POC URINALYSIS DIP STICK AUTO W/O MICRO    4. Missed menses  - AMB POC URINE PREGNANCY TEST, VISUAL COLOR COMPARISON    5. Dysmenorrhea  Irregular uncomfortable periods. Pt interested in hormonal regulation of this. Discussed IUD risks and benefits. Pt would like to move forward with Mirena. Prescription ordered today.       Return in about 4 weeks (around 06/09/2022) for once IUD is received..      Subjective/Objective:  HPI    UTI  - present for about a week  - Gradually worsening. Started with smell and now with dysuria and frequency.   - Intermittent pink tinge to urine.   - Denies: fevers, chills, back pain    Weight loss- About 20lbs in the last 7 months.  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 Did unfortunately lead to weight gain.       Physical Exam  Blood pressure 108/68, pulse 90, temperature 97.5 F (36.4 C), temperature source Tympanic, height 1.689 m (5' 6.5"), weight 131.9 kg (290 lb 12.8 oz), SpO2 97 %. Body mass index is 46.23 kg/m.  General appearance - Alert, NAD, normal appearance.   Head - Atraumatic. Normocephalic.   Eyes - EOMI. Sclera white.   Respiratory - Pulmonary effort is normal. No respiratory distress.   Neurological - No  gross focal deficits. Speech normal.   Psychiatric - Mood normal. Behavior normal.     Medical History- Reviewed Social History- Reviewed Surgical History- Reviewed   Susan Ruiz has no past medical history on file. Susan Ruiz reports that she has been smoking cigarettes. She has never used smokeless tobacco. She reports that she does not drink alcohol and does not use drugs. [name] has no past surgical history on file.         Problem List- Reviewed   Susan Ruiz has Moderate episode of recurrent major depressive disorder ([place]); Recurrent UTI; and Class 3 severe obesity due to excess calories without serious comorbidity with body mass index (BMI) of 45.0 to 49.9 in adult [facility]) on their problem list.       Current Outpatient Medications   Medication Instructions    nitrofurantoin (macrocrystal-monohydrate) (MACROBID) 100 mg, Oral, 2 TIMES DAILY    sertraline (ZOLOFT) 100 mg, Oral, DAILY           Note to patient: The purpose of this note is to communicate with the other providers involved in your care. It is written using standard medical terminology. The voice recognition software Dragon was used and quite often unanticipated grammatical, syntax, homophones, and other interpretive errors are inadvertently transcribed. Please disregard these and excuse any that have escaped final proofreading. If you  have questions regarding details of the note please call my office at [phone] and make an appointment to discuss your concerns.    An electronic signature was used to authenticate this note.  -- Vivianne Master, MD

## 2022-05-16 LAB — CULTURE, URINE: Colony count: >100000

## 2022-05-24 ENCOUNTER — Encounter

## 2022-05-24 NOTE — Telephone Encounter (Signed)
This patient contacted office for the following prescriptions to be filled:    Medication requested :   Requested Prescriptions     Pending Prescriptions Disp Refills    sertraline (ZOLOFT) 100 MG tablet 90 tablet 3     Sig: Take 1 tablet by mouth daily          PCP: [place] or Print: [facility]  Mail order or Local pharmacy: [phone]    Scheduled appointment if not seen by current providers in office: LOV  05/12/22, only f/u was to return in 4 weeks for IUD, not scheduled    Pt was given a years worth in Nov but can just no longer fill at [facility] due to insurance, she is requesting this go to the [facility].

## 2022-05-25 MED ORDER — SERTRALINE HCL 100 MG PO TABS
100 MG | ORAL_TABLET | Freq: Every day | ORAL | 3 refills | Status: AC
Start: 2022-05-25 — End: 2022-09-22

## 2022-06-15 ENCOUNTER — Ambulatory Visit
Payer: PRIVATE HEALTH INSURANCE | Attending: Student in an Organized Health Care Education/Training Program | Primary: Student in an Organized Health Care Education/Training Program

## 2022-06-16 ENCOUNTER — Ambulatory Visit
Admit: 2022-06-16 | Payer: PRIVATE HEALTH INSURANCE | Attending: Student in an Organized Health Care Education/Training Program | Primary: Student in an Organized Health Care Education/Training Program

## 2022-06-16 DIAGNOSIS — B379 Candidiasis, unspecified: Secondary | ICD-10-CM

## 2022-06-16 MED ORDER — FLUCONAZOLE 150 MG PO TABS
150 MG | ORAL_TABLET | ORAL | 2 refills | Status: AC
Start: 2022-06-16 — End: 2022-06-22

## 2022-06-16 NOTE — Progress Notes (Unsigned)
"  Have you been to the ER, urgent care clinic since your last visit?  Hospitalized since your last visit?"    NO    "Have you seen or consulted any other health care providers outside of [facility] since your last visit?"    NO    Chief Complaint   Patient presents with    Vaginitis     Pt states symptoms started Thursday. Pt having thick white discharge with odor.

## 2022-06-16 NOTE — Progress Notes (Signed)
Susan Ruiz (DOB: 2001/03/30) is a 22 y.o. female, established patient, here for evaluation of the following chief complaint(s):  Vaginitis (Pt states symptoms started Thursday. Pt having thick white discharge with odor. )       Assessment/Plan:  1. Yeast infection  Clinical yeast infection with normal UA. History of this a few times a yr. Discussed hygeinic precautions to try and prevent repeat infections. Diflucan today with a few refills to last the yr hopefully. If having more frequent than that, would recommend eval by Gyn for recurrent yeast infection  - fluconazole (DIFLUCAN) 150 MG tablet; Take 1 tablet by mouth every 72 hours for 6 days  Dispense: 2 tablet; Refill: 2      Return if symptoms worsen or fail to improve.      Subjective/Objective:  HPI    Vaginal discharge  - Started about 10 days ago.   - Malodorous, white discharge. Thick and clumpy. Pruritus.   - Denies: dysuria.   - Meds attempted: none.   - Worsening.   - Sexual activity- not using lubes. Denies anal to vaginal intercourse.       Physical Exam  Pulse 78, temperature 97.3 F (36.3 C), temperature source Tympanic, resp. rate 16, weight 129.9 kg (286 lb 6 oz), last menstrual period 05/24/2022, SpO2 98 %. Body mass index is 45.53 kg/m.  General appearance - Alert, NAD, normal appearance.   Head - Atraumatic. Normocephalic.   Eyes - EOMI. Sclera white.   Respiratory - Pulmonary effort is normal. No respiratory distress.   Neurological - No gross focal deficits. Speech normal.   Psychiatric - Mood normal. Behavior normal.     Medical History- Reviewed Social History- Reviewed Surgical History- Reviewed   Susan Ruiz has no past medical history on file. Susan Ruiz reports that she has been smoking cigarettes. She has never used smokeless tobacco. She reports that she does not drink alcohol and does not use drugs. Susan Ruiz has no past surgical history on file.         Problem List- Reviewed   Susan Ruiz has Moderate episode of recurrent major  depressive disorder ([place]); Recurrent UTI; and Class 3 severe obesity due to excess calories without serious comorbidity with body mass index (BMI) of 45.0 to 49.9 in adult [facility]) on their problem list.       Current Outpatient Medications   Medication Instructions    fluconazole (DIFLUCAN) 150 mg, Oral, EVERY 72 HOURS    sertraline (ZOLOFT) 100 mg, Oral, DAILY           Note to patient: The purpose of this note is to communicate with the other providers involved in your care so is written using standard medical terminology. The voice recognition software Dragon was used. Please disregard any unanticipated grammatical, syntax, homophones, or other interpretive errors that have escaped my final proofreading. If you have questions regarding details of the note please call my office at [phone] and make an appointment to discuss your concerns.    An electronic signature was used to authenticate this note.  -- Vivianne Master, MD

## 2022-06-27 NOTE — Telephone Encounter (Signed)
Pt called to speak to [facility] or another clinical member in regards to labs done on 06/16/22 . Informed pt that I did not see any labs done on that date in which pt responded that she is sure that she did have labs done on 2/1. Informed pt that I would route a message back for the clinical staff to review as soon as possible so that they may go over these results with her. Please review and advise as soon as possible.

## 2022-07-01 NOTE — Telephone Encounter (Signed)
Spoke with Susan Ruiz to inform her of the lab test that was done previous. Pt states she is not having any problems now.

## 2022-07-19 NOTE — Telephone Encounter (Signed)
Called patient to discuss request for refills, no answer left message letting her know I was calling her to discuss her refill requests asked that she call the office back.

## 2022-07-19 NOTE — Telephone Encounter (Signed)
-----   Message from Edmonia Lynch sent at 07/19/2022 11:08 AM EST -----  Subject: Refill Request    QUESTIONS  Name of Medication? mupirocin (BACTROBAN) 2 % ointment  Patient-reported dosage and instructions? Apply topically 3 times daily.   Use for 7 days.  How many days do you have left? 0  Preferred Pharmacy? [facility]  Pharmacy phone number (if available)? [phone]  ---------------------------------------------------------------------------  --------------,  Name of Medication? fluconazole (DIFLUCAN) 150 MG tablet  Patient-reported dosage and instructions? take 1 tablet by mouth once for   1 dose  How many days do you have left? 0  Preferred Pharmacy? [place]  Pharmacy phone number (if available)? [phone]  ---------------------------------------------------------------------------  --------------  CALL BACK INFO  What is the best way for the office to contact you? OK to leave message on   voicemail  Preferred Call Back Phone Number? [phone]  ---------------------------------------------------------------------------  --------------  SCRIPT ANSWERS  Relationship to Patient? Self

## 2022-07-22 NOTE — Telephone Encounter (Signed)
 Called patient DOB verified she says she does not need refills sent in on her Diflucan  but is requesting refills on her mupirocin  ointment. She was last seen on 06/16/22 with plan to f/u PRN. Medication was last sent in on 08/23/2021 with no refills. Please review and sign if appropriate.

## 2022-07-26 MED ORDER — MUPIROCIN 2 % EX OINT
2 | CUTANEOUS | 0 refills | Status: AC
Start: 2022-07-26 — End: 2022-07-29

## 2022-08-27 DIAGNOSIS — J302 Other seasonal allergic rhinitis: Secondary | ICD-10-CM

## 2022-08-27 NOTE — ED Provider Notes (Addendum)
[facility] EMERGENCY DEPT  EMERGENCY DEPARTMENT HISTORY AND PHYSICAL EXAM      Date: 08/27/2022  Patient Name: Susan Ruiz  MRN: [id number]  Birthdate 09-15-2000  Date of evaluation: 08/27/2022  Provider: Dr. Unknown Jim, MD  Note Started: 11:01 PM EDT 08/27/22    HISTORY OF PRESENT ILLNESS     Chief Complaint   Patient presents with    Cough    Pharyngitis    Otalgia    Emesis       History Provided By: Patient    HPI: Susan Ruiz is a 22 y.o. female.  Patient presents with a complaint of severe nasal congestion with postnasal drip and bilateral ear pressure discomfort.  Patient also complains of occasional nonproductive cough with posttussive vomiting.  Mild sore throat.  No change in voice.  No fever or chills.  No headache or generalized body ache.  No OTC treatment.        PAST MEDICAL HISTORY   Past Medical History:  History reviewed. No pertinent past medical history.    Past Surgical History:  History reviewed. No pertinent surgical history.    Family History:  History reviewed. No pertinent family history.    Social History:  Social History     Tobacco Use    Smoking status: Every Day     Current packs/day: 0.25     Types: Cigarettes    Smokeless tobacco: Never   Vaping Use    Vaping Use: Never used   Substance Use Topics    Alcohol use: Never    Drug use: Never       Allergies:  No Known Allergies    PCP: Hermina Staggers, MD    Current Meds:   No current facility-administered medications for this encounter.     Current Outpatient Medications   Medication Sig Dispense Refill    loratadine (CLARITIN) 10 MG tablet Take 1 tablet by mouth daily 30 tablet 0    predniSONE (DELTASONE) 20 MG tablet Take 3 tablets once a day for 2 days, then take 2 tablets once a day for 2 days, then take 1 take once a day for 2 days 12 tablet 1    pseudoephedrine (DECONGESTANT) 30 MG tablet Take 2 tablets by mouth in the morning and at bedtime for 3 days 12 tablet 0    sertraline (ZOLOFT) 100 MG tablet Take 1 tablet by mouth  daily 90 tablet 3       Social Determinants of Health:   Social Determinants of Health     Tobacco Use: High Risk (08/27/2022)    Patient History     Smoking Tobacco Use: Every Day     Smokeless Tobacco Use: Never     Passive Exposure: Not on file   Alcohol Use: Not At Risk (08/27/2022)    AUDIT-C     Frequency of Alcohol Consumption: Never     Average Number of Drinks: Patient does not drink     Frequency of Binge Drinking: Never   Financial Resource Strain: Patient Declined (08/23/2021)    Overall Financial Resource Strain (CARDIA)     Difficulty of Paying Living Expenses: Patient declined   Food Insecurity: Not on file (08/23/2021)   Transportation Needs: Unknown (08/23/2021)    PRAPARE - Therapist, art (Medical): Not on file     Lack of Transportation (Non-Medical): Patient declined   Physical Activity: Not on file   Stress: Not on file   Social Connections:  Not on file   Intimate Partner Violence: Not on file   Depression: At risk (09/03/2021)    PHQ-2     PHQ-2 Score: 20   Housing Stability: Unknown (08/23/2021)    Housing Stability Vital Sign     Unable to Pay for Housing in the Last Year: Not on file     Number of Places Lived in the Last Year: Not on file     Unstable Housing in the Last Year: Patient refused   Interpersonal Safety: Not At Risk (12/05/2021)    Interpersonal Safety Domain Source: IP Abuse Screening     Physical abuse: Denies     Verbal abuse: Denies     Emotional abuse: Denies     Financial abuse: Denies     Sexual abuse: Denies   Utilities: Not on file       PHYSICAL EXAM   Physical Exam  Vitals and nursing note reviewed.   Constitutional:       General: She is not in acute distress.     Appearance: Normal appearance. She is not ill-appearing, toxic-appearing or diaphoretic.   HENT:      Head: Normocephalic and atraumatic.      Right Ear: Tympanic membrane normal.      Left Ear: Tympanic membrane normal.      Nose: Congestion and rhinorrhea present.      Mouth/Throat:       Mouth: Mucous membranes are moist.      Comments: Cobblestone appearance pharynx.  No exudate.  No tonsillar enlargement.  Uvula midline.  No muffled voice.  No stridor  Eyes:      General: No scleral icterus.     Conjunctiva/sclera: Conjunctivae normal.      Comments: No photophobia.   Cardiovascular:      Rate and Rhythm: Normal rate and regular rhythm.   Pulmonary:      Effort: Pulmonary effort is normal.      Breath sounds: Normal breath sounds. No wheezing, rhonchi or rales.   Abdominal:      General: Abdomen is flat. Bowel sounds are normal.      Palpations: Abdomen is soft.   Musculoskeletal:      Cervical back: Neck supple. No rigidity.      Right lower leg: No edema.      Left lower leg: No edema.   Skin:     General: Skin is warm and dry.   Neurological:      General: No focal deficit present.      Mental Status: She is alert and oriented to person, place, and time.   Psychiatric:         Mood and Affect: Mood normal.         Behavior: Behavior normal.           SCREENINGS                   LAB, EKG AND DIAGNOSTIC RESULTS   Labs:  No results found for this or any previous visit (from the past 12 hour(s)).        ED COURSE and DIFFERENTIAL DIAGNOSIS/MDM   11:04 PM Differential and Considerations:     Differential diagnosis that were considered include: Seasonal allergy, URI, otitis media, pharyngitis, tonsillitis    Patient present with symptoms/signs suggestive of seasonal allergy with severe nasal congestion, postnasal drip, and occasional nonproductive cough.  Alert nontoxic-appearing.  Well-hydrated.  No respiratory distress.  No clinical signs of upper obstruction  No clinical concern for sepsis or pneumonia.    Patient was discharged in stable condition with a course of Claritin, prednisone, and Sudafed.  Return cautions given        Records Reviewed (source and summary of external notes): Prior medical records and Nursing notes.    Vitals:    Vitals:    08/27/22 2251   BP: 102/61   Pulse: 100    Resp: 18   Temp: 98.7 F (37.1 C)   TempSrc: Oral   SpO2: 100%   Weight: 127 kg (280 lb)   Height: 1.676 m (5\' 6" )        ED COURSE     Tolerated p.o. well without      Patient was given the following medications:  Medications   predniSONE (DELTASONE) tablet 60 mg (60 mg Oral Given 08/27/22 2303)   acetaminophen (TYLENOL) tablet 1,000 mg (1,000 mg Oral Given 08/27/22 2303)   ibuprofen (ADVIL;MOTRIN) tablet 400 mg (400 mg Oral Given 08/27/22 2304)       CONSULTS: See ED Course/MDM for further details.  None           PROCEDURES   Unless otherwise noted above, none  Procedures      CRITICAL CARE TIME       ED IMPRESSION     1. Seasonal allergies          DISPOSITION/PLAN   DISPOSITION Decision To Discharge 08/27/2022 10:59:30 PM    Discharge Note: The patient is stable for discharge home. The signs, symptoms, diagnosis, and discharge instructions have been discussed, understanding conveyed, and agreed upon. The patient is to follow up as recommended or return to ER should their symptoms worsen.      PATIENT REFERRED TO:  Your Primary Care doctor    Call in 1 day          DISCHARGE MEDICATIONS:     Medication List        START taking these medications      loratadine 10 MG tablet  Commonly known as: Claritin  Take 1 tablet by mouth daily     predniSONE 20 MG tablet  Commonly known as: DELTASONE  Take 3 tablets once a day for 2 days, then take 2 tablets once a day for 2 days, then take 1 take once a day for 2 days     pseudoephedrine 30 MG tablet  Commonly known as: Decongestant  Take 2 tablets by mouth in the morning and at bedtime for 3 days            ASK your doctor about these medications      sertraline 100 MG tablet  Commonly known as: ZOLOFT  Take 1 tablet by mouth daily               Where to Get Your Medications        These medications were sent to [facility] [address] [place], [place] - [address] - P [id number] Carmon Ginsberg [phone]  [address], [place] [address]      Phone:  [phone]   loratadine 10 MG tablet  predniSONE 20 MG tablet  pseudoephedrine 30 MG tablet           DISCONTINUED MEDICATIONS:  Current Discharge Medication List          I am the Primary Clinician of Record. Alice Vitelli Theotis Barrio, MD (electronically signed)    (Please note that parts of this dictation  were completed with voice recognition software. Quite often unanticipated grammatical, syntax, homophones, and other interpretive errors are inadvertently transcribed by the computer software. Please disregards these errors. Please excuse any errors that have escaped final proofreading.)     Rosalin Hawking, MD  08/27/22 2303       Rosalin Hawking, MD  08/27/22 [phone]

## 2022-08-27 NOTE — ED Triage Notes (Signed)
Pt to ed r/t flu like symptoms that started on Monday.

## 2022-08-28 ENCOUNTER — Inpatient Hospital Stay
Admit: 2022-08-28 | Discharge: 2022-08-28 | Disposition: A | Discharge status: Home / Self Care | Payer: PRIVATE HEALTH INSURANCE | Attending: Emergency Medicine

## 2022-08-28 MED ORDER — LORATADINE 10 MG PO TABS
10 | ORAL_TABLET | Freq: Every day | ORAL | 0 refills | Status: AC
Start: 2022-08-28 — End: 2022-09-26

## 2022-08-28 MED ORDER — IBUPROFEN 400 MG PO TABS
400 | ORAL | Status: AC
Start: 2022-08-28 — End: 2022-08-27
  Administered 2022-08-28: 03:00:00 400 mg via ORAL

## 2022-08-28 MED ORDER — PREDNISONE 20 MG PO TABS
20 | ORAL_TABLET | ORAL | 1 refills | 5.00000 days | Status: DC
Start: 2022-08-28 — End: 2024-03-15

## 2022-08-28 MED ORDER — PREDNISONE 20 MG PO TABS
20 | ORAL | Status: AC
Start: 2022-08-28 — End: 2022-08-27
  Administered 2022-08-28: 03:00:00 60 mg via ORAL

## 2022-08-28 MED ORDER — PSEUDOEPHEDRINE HCL 30 MG PO TABS
30 | ORAL_TABLET | Freq: Two times a day (BID) | ORAL | 0 refills | Status: AC
Start: 2022-08-28 — End: 2022-08-30

## 2022-08-28 MED ORDER — ACETAMINOPHEN 500 MG PO TABS
500 | ORAL | Status: AC
Start: 2022-08-28 — End: 2022-08-27
  Administered 2022-08-28: 03:00:00 1000 mg via ORAL

## 2022-08-28 MED FILL — PREDNISONE 20 MG PO TABS: 20 MG | ORAL | Qty: 3

## 2022-08-28 MED FILL — IBUPROFEN 400 MG PO TABS: 400 MG | ORAL | Qty: 1

## 2022-08-28 MED FILL — ACETAMINOPHEN EXTRA STRENGTH 500 MG PO TABS: 500 MG | ORAL | Qty: 2

## 2022-09-20 ENCOUNTER — Ambulatory Visit
Payer: PRIVATE HEALTH INSURANCE | Attending: Student in an Organized Health Care Education/Training Program | Primary: Student in an Organized Health Care Education/Training Program

## 2022-09-22 ENCOUNTER — Ambulatory Visit
Admit: 2022-09-22 | Discharge: 2022-09-22 | Payer: PRIVATE HEALTH INSURANCE | Attending: Student in an Organized Health Care Education/Training Program | Primary: Student in an Organized Health Care Education/Training Program

## 2022-09-22 DIAGNOSIS — F331 Major depressive disorder, recurrent, moderate: Secondary | ICD-10-CM

## 2022-09-22 MED ORDER — BUPROPION HCL ER (XL) 150 MG PO TB24
150 | ORAL_TABLET | Freq: Every morning | ORAL | 1 refills | Status: DC
Start: 2022-09-22 — End: 2024-03-15

## 2022-09-22 MED ORDER — SERTRALINE HCL 100 MG PO TABS
100 MG | ORAL_TABLET | Freq: Every day | ORAL | 1 refills | Status: DC
Start: 2022-09-22 — End: 2023-04-21

## 2022-09-22 MED ORDER — FLUCONAZOLE 150 MG PO TABS
150 | ORAL_TABLET | ORAL | 0 refills | Status: AC
Start: 2022-09-22 — End: 2022-09-28

## 2022-09-22 NOTE — Patient Instructions (Addendum)
[place]  PRIMARY CARE & FAMILY MEDICINE  [facility]  [address] [place], [address], [place], [place] [address]       Sleep Hygiene  - No TV or phone use 30 minutes before bedtime.   - Have a 30 minute routine of winding down, quiet, calm environment  - The bed is for sleep and sex. Avoid all other activities in the bedroom  - Avoid any naps longer than 15 minutes.   - No caffeine after noon.   - Turn clocks away from facing the bed  - If you wake up for 30 minutes or more, get out of bed. Do some other quiet calm activity for 15 minutes and then return to the bed and try again.           [employer]*  (Call 211 if need more resources.)    Thomas Hoff of [place] and the [employer]  What they offer: Assistance with finding a food pantry, soup kitchen or resource near you.  Website: http://www.garza-graham.com/  Provide instructions for assistance with [employer] Engineer, maintenance (IT)) application on this site.     SNAP (formerly Sales executive)  What they offer: SNAP is used like cash to buy eligible food items from authorized retailers.  Apply for benefits online: StreamingLessons.se  Apply for benefits by telephone: [phone]    Meals on Wheels  What they offer: Meals on Wheels is a program that delivers meals to individuals at home who are unable to purchase or prepare their own meals.   Website: https://feedmore.org/how-we-help/meals-on-wheels/  Requirements can vary by program and areas served.   To apply:  Designer, industrial/product of [place]: [phone] -Fri 8:30 a.m. to 4:30 p.m.)  Coverage Area:  [place], [place], [place], [place], [address], [place], [place], [place] of [place] & [place] [employer]  Website: http://floyd.org/  [place] [employer] Agency On Aging: [phone]   Coverage Areas: [place], [place], [place], [place], [place].      [employer] Facilities manager  What they offer:  [employer]asis provides comprehensive services to the disadvantaged/low-income community, homebound seniors and homeless in [place], [place], and [place].  Phone Number: [phone]  Services:  Food pantry, Sales promotion account executive, Doctor, general practice, Chief of Staff, Provides assistance with completing [employer].   Website: https://www.oasissocialministry.org/services      Therapist, occupational  What they offer: Deliver a bag filled with food and personal care items to isolated elderly persons and conduct 1:1 visits  Website: MassVoice.co.uk  Visit the website and select "Contact" to send a message    Healthy Food Pantry  What they offer: Produce and healthy food options to [place] and surrounding neighborhood in the [place] area.   Contact: [phone]  Helpful Info: Open every Monday 9am-11am.                                             [employer] Area Financial Resources*  (Call 211 if need more resources.)   Medical  [facility] Financial Assistance  What they offer: The [employer] Program helps uninsured patients who do not qualify for government-sponsored health insurance and cannot afford to pay for their medical care. Insured patient may also qualify for assistance based on family income, family size, and medical needs.   Phone Number:  [phone]  How to apply for the [employer] Program:  Option 1: To apply for financial assistance, a patient (or their family or other               provider) should fill out the Software engineer. Copies of the Financial Assistance Application and the FAP may be obtained for free by calling the [employer] customer service department at [phone].  Option 2:  The Software engineer and policy may be obtained for free by downloading a copy from the [employer]:                        https://www.bonsecurs.com/patient-resources/financial-assistance                         The [employer].pafcares.org  What they offer: The [place] Program can assist you if you're uninsured and have been diagnosed with chronic, debilitating, or life-threatening disease.   Phone Number: [phone]  Website: www.pafcares.org    Care-[employer]-Van  What they offer: Mobile health clinic for uninsured community members  Phone number: [phone]    Utilities  CommonHelp  What they offer: Partnership with the [place] Department of [facility]. Assist with finding and applying for government funded programs and benefits.  You can also update your benefits or report changes through CommonHelp.  Website: StreamingLessons.se  Phone Number: 833-5CALLVA [phone])        [employer]  What they offer: Charlsie Merles is [employer]'s energy assistance program of last resort for anyone who faces financial hardships from unemployment or family crisis.   Phone Number: [phone]   Address: [address], [place], [place] [address]   Website: https://www.dominionenergy.com/[place]/billing/billing-options/energyshare     Energy Assistance Programs (EAP) - Department of Social Services   What they offer: EAP assists low income households in meeting their immediate home energy needs.   Phone Number: [phone] or contact your local department of social services   Website: http://www.meadows-mitchell.com/     Financial  Coalition Against Poverty (CAPS)  What they offer: Provide support to [employer] residents who are experiencing crises by assessing their needs, providing information and resources, and directly contributing financially  Website: DCHeadlines.cz  [facility] residents can make an appointment by calling CAPS at [phone] or emailing [email or web address]      Medication  Good  Rx   What they offer: Good Rx tracks prescription drug prices and provides free drug coupons for discounts on medications.   Website: https://www.goodrx.com/     NeedyMeds   What they offer: NeedyMeds offers free information on medications and healthcare cost savings programs including prescription assistance programs, coupons, and discount programs.   Website: https://www.needymeds.org/   Helpline: (204) 297-2551     RX Assist   What they offer: Information about free and low-cost medicine programs.   Website: https://www.clark.net/         Walmart $4 Prescription Program   What they offer: Prescription Program includes up to a 30-day supply for $4 and a 90-day supply for $10 of some covered generic drugs at commonly prescribed dosages   Website: https://jacobson-moore.net/      The Co-Pay Relief Program  What they offer: The Co-Pay Relief Program provides you with direct prescription co-payment assistance, if you are an insured U.S. citizen and financially and medically qualify, including Medicare Part D beneficiaries who require assistance with their prescription drug co-payments.  Phone toll-free: (914)513-5697  Website: www.copay.org    The Partnership for Prescription Assistance   What they offer:  The Partnership for Prescription Assistance can help you if you lack Prescription coverage to get the medicines you need through the public or private program that is right for you.  Phone toll-free: 530-770-2715  Website:  www.pparx.org      [place] Drug [facility]  What they offer:   The [place] Drug Card Program gives you and your family access to a free Prescription Drug Card program and savings of up to 75% at more than 50,000 national and        Walgreen.  Phone toll-free: 3081679014  Website: www.virginiadrugcard.com        [employer]*  (Call 211 if need more resources.)   Medical  [employer] Assistance  What they offer: The  Bank [employer] Program helps uninsured patients who do not qualify for government-sponsored health insurance and cannot afford to pay for their medical care. Insured patient may also qualify for assistance based on family income, family size, and medical needs.   Phone Number: 8040218208  How to apply for the [employer] Assistance Program:  Option 1: To apply for financial assistance, a patient (or their family or other               provider) should fill out the Software engineer. Copies of the Financial Assistance Application and the FAP may be obtained for free by calling the [employer] customer service department at [phone].  Option 2:  The Software engineer and policy may be obtained for free by downloading a copy from the [employer]:                       https://www.bonsecurs.com/patient-resources/financial-assistance                         The [employer].pafcares.org  What they offer: The [place] Program can assist you if you're uninsured and have been diagnosed with chronic, debilitating, or life-threatening disease.   Phone Number: 8148113307  Website: www.pafcares.org    [facility]  What they offer: Mobile health clinic for uninsured community members  Phone number: 9730838445    Utilities  CommonHelp  What they offer: Partnership with the [place] Department of [facility]. Assist with finding and applying for government funded programs and benefits.  You can also update your benefits or report changes through CommonHelp.  Website: StreamingLessons.se  Phone Number: 833-5CALLVA 510-837-2291)        [employer] [employer]  What they offer: Charlsie Merles is [employer]'s energy assistance program of last resort for anyone who faces financial hardships from unemployment or family crisis.   Phone Number: (403)713-4718   Address: [address],  [place], [place] [address]   Website: https://www.dominionenergy.com/[place]/billing/billing-options/energyshare     Energy Assistance Programs (EAP) - Department of Social Services   What they offer: EAP assists low income households in meeting their immediate home energy needs.   Phone Number: [phone] or contact your local department of social services   Website: http://www.meadows-mitchell.com/     Financial  Coalition Against Poverty (CAPS)  What they offer: Provide support to [facility] residents who are experiencing crises by assessing their needs, providing information and resources, and directly contributing financially  Website: DCHeadlines.cz  [place] residents can make an appointment  by calling CAPS at [phone] or emailing [email or web address]      Medication  Good Rx   What they offer: Good Rx tracks prescription drug prices and provides free drug coupons for discounts on medications.   Website: https://www.goodrx.com/     NeedyMeds   What they offer: NeedyMeds offers free information on medications and healthcare cost savings programs including prescription assistance programs, coupons, and discount programs.   Website: https://www.needymeds.org/   Helpline: 8120604949     RX Assist   What they offer: Information about free and low-cost medicine programs.   Website: https://www.clark.net/         Walmart $4 Prescription Program   What they offer: Prescription Program includes up to a 30-day supply for $4 and a 90-day supply for $10 of some covered generic drugs at commonly prescribed dosages   Website: https://jacobson-moore.net/      The Co-Pay Relief Program  What they offer: The Co-Pay Relief Program provides you with direct prescription co-payment assistance, if you are an insured U.S. citizen and financially and medically qualify, including Medicare Part D beneficiaries who require assistance with their prescription drug  co-payments.   Phone toll-free: 312-659-3681  Website: www.copay.org    The Partnership for Prescription Assistance   What they offer:  The Partnership for Prescription Assistance can help you if you lack Prescription coverage to get the medicines you need through the public or private program that is right for you.  Phone toll-free: 573-539-7637  Website:  www.pparx.org      [place] Drug [facility]  What they offer:   The [place] Drug Card Program gives you and your family access to a free Prescription Drug Card program and savings of up to 75% at more than 50,000 national and        Walgreen.  Phone toll-free: 775-078-0762  Website: www.virginiadrugcard.com

## 2022-09-22 NOTE — Progress Notes (Unsigned)
Chief Complaint   Patient presents with    Gynecologic Exam     Follow up on paperwork.       1. "Have you been to the ER, urgent care clinic since your last visit?  Hospitalized since your last visit?" Yes allergic reactin    2. "Have you seen or consulted any other health care providers outside of the [facility] since your last visit?" No     3. For patients aged 23-75: Has the patient had a colonoscopy / FIT/ Cologuard? NA - based on age      If the patient is female:    4. For patients aged 71-74: Has the patient had a mammogram within the past 2 years? NA - based on age or sex      33. For patients aged 21-65: Has the patient had a pap smear? Yes - no Care Gap present

## 2022-09-22 NOTE — Progress Notes (Incomplete)
Susan Ruiz (DOB: 21-Apr-2001) is a 22 y.o. female, established patient, here for evaluation of the following chief complaint(s):  Gynecologic Exam (Follow up on paperwork.)       Assessment/Plan:  ***    Return in about 3 months (around 12/23/2022) for mental health check with new PCP.      Subjective/Objective:  HPI    Depression/anxiety/low energy  - Low energy. Very tired. Frequent long naps.   - Somewhat feels like the low energy is not necessarily related to her mood.  - Some decreased motivation and interest in regular activities.     Physical Exam  Blood pressure 116/74, pulse 80, temperature 97.7 F (36.5 C), temperature source Tympanic, resp. rate 16, weight 135.9 kg (299 lb 8 oz), last menstrual period 09/10/2022, SpO2 98 %. Body mass index is 48.34 kg/m.      Medical History- Reviewed Social History- Reviewed Surgical History- Reviewed   Genecis has no past medical history on file. Amarylis reports that she has been smoking cigarettes. She has never used smokeless tobacco. She reports that she does not drink alcohol and does not use drugs. Marciana has no past surgical history on file.         Problem List- Reviewed   Lavene has Moderate episode of recurrent major depressive disorder (HCC); Recurrent UTI; and Class 3 severe obesity due to excess calories without serious comorbidity with body mass index (BMI) of 45.0 to 49.9 in adult [facility]) on their problem list.       Current Outpatient Medications   Medication Instructions    buPROPion (WELLBUTRIN XL) 150 mg, Oral, EVERY MORNING    fluconazole (DIFLUCAN) 150 mg, Oral, EVERY 72 HOURS    loratadine (CLARITIN) 10 mg, Oral, DAILY    predniSONE (DELTASONE) 20 MG tablet Take 3 tablets once a day for 2 days, then take 2 tablets once a day for 2 days, then take 1 take once a day for 2 days    sertraline (ZOLOFT) 100 mg, Oral, DAILY       {Time Documentation:[id number]}    Note to patient: The purpose of this note is to communicate with the other providers  involved in your care so is written using standard medical terminology. The voice recognition software Dragon was used. Please disregard any unanticipated grammatical, syntax, homophones, or other interpretive errors that have escaped my final proofreading. If you have questions regarding details of the note please call my office at [phone] and make an appointment to discuss your concerns.    An electronic signature was used to authenticate this note.  -- Hermina Staggers, MD

## 2022-11-03 ENCOUNTER — Encounter

## 2022-11-03 NOTE — Telephone Encounter (Signed)
This patient contacted office for the following prescriptions to be filled:    Medication requested fluconazole (DIFLUCAN) 150 MG tablet:   PCP: Dr. Francesca Jewett  Pharmacy or Print: [facility]  Mail order or Local pharmacy [phone]    Scheduled appointment if not seen by current providers in office: LOV 09/22/22 No upcoming scheduled.

## 2022-11-08 NOTE — Telephone Encounter (Signed)
Tried calling patient at number on file and that number is not working. Note for if patient calls back concerning medication refill. Note from Dr. Concerning that request.

## 2022-11-26 ENCOUNTER — Inpatient Hospital Stay
Admit: 2022-11-26 | Discharge: 2022-11-26 | Disposition: A | Discharge status: Home / Self Care | Payer: PRIVATE HEALTH INSURANCE | Attending: Emergency Medicine

## 2022-11-26 DIAGNOSIS — N926 Irregular menstruation, unspecified: Secondary | ICD-10-CM

## 2022-11-26 LAB — POC PREGNANCY UR-QUAL: Preg Test, Ur: NEGATIVE

## 2022-11-26 NOTE — ED Triage Notes (Signed)
Pt would like to have a pregnancy test done

## 2022-11-26 NOTE — ED Provider Notes (Signed)
[facility] EMERGENCY DEPT  EMERGENCY DEPARTMENT HISTORY AND PHYSICAL EXAM      Date: 11/26/2022  Patient Name: Susan Ruiz  MRN: [id number]  Birthdate 25-Nov-2000  Date of evaluation: 11/26/2022  Provider: Dr. Unknown Jim, MD  Note Started: 2:35 AM EDT 11/26/22    HISTORY OF PRESENT ILLNESS     Chief Complaint   Patient presents with    Pregnancy Test       History Provided By: Patient    HPI: Susan Ruiz is a 22 y.o. female.  Patient presents emergency room with request for pregnancy test.  Patient states that her menstruation is 2 weeks late.  No dysuria hematuria.  No abdominal pain.  No nausea vomiting.  G0, P0        PAST MEDICAL HISTORY   Past Medical History:  History reviewed. No pertinent past medical history.    Past Surgical History:  History reviewed. No pertinent surgical history.    Family History:  History reviewed. No pertinent family history.    Social History:  Social History     Tobacco Use    Smoking status: Every Day     Current packs/day: 0.25     Types: Cigarettes    Smokeless tobacco: Never   Vaping Use    Vaping Use: Never used   Substance Use Topics    Alcohol use: Never    Drug use: Never       Allergies:  No Known Allergies    PCP: Hermina Staggers, MD    Current Meds:   No current facility-administered medications for this encounter.     Current Outpatient Medications   Medication Sig Dispense Refill    sertraline (ZOLOFT) 100 MG tablet Take 1 tablet by mouth daily 90 tablet 1    buPROPion (WELLBUTRIN XL) 150 MG extended release tablet Take 1 tablet by mouth every morning 90 tablet 1    predniSONE (DELTASONE) 20 MG tablet Take 3 tablets once a day for 2 days, then take 2 tablets once a day for 2 days, then take 1 take once a day for 2 days (Patient not taking: Reported on 09/22/2022) 12 tablet 1       Social Determinants of Health:   Social Determinants of Health     Tobacco Use: High Risk (11/26/2022)    Patient History     Smoking Tobacco Use: Every Day     Smokeless Tobacco Use: Never      Passive Exposure: Not on file   Alcohol Use: Not At Risk (08/27/2022)    AUDIT-C     Frequency of Alcohol Consumption: Never     Average Number of Drinks: Patient does not drink     Frequency of Binge Drinking: Never   Financial Resource Strain: Medium Risk (09/22/2022)    Overall Financial Resource Strain (CARDIA)     Difficulty of Paying Living Expenses: Somewhat hard   Food Insecurity: Food Insecurity Present (09/22/2022)    Hunger Vital Sign     Worried About Running Out of Food in the Last Year: Never true     Ran Out of Food in the Last Year: Sometimes true   Transportation Needs: Unknown (09/22/2022)    PRAPARE - Therapist, art (Medical): Not on file     Lack of Transportation (Non-Medical): No   Physical Activity: Not on file   Stress: Not on file   Social Connections: Not on file   Intimate Partner Violence: Not on  file   Depression: At risk (09/22/2022)    PHQ-2     PHQ-2 Score: 18   Housing Stability: Unknown (09/22/2022)    Housing Stability Vital Sign     Unable to Pay for Housing in the Last Year: Not on file     Number of Places Lived in the Last Year: Not on file     Unstable Housing in the Last Year: No   Interpersonal Safety: Not At Risk (12/05/2021)    Interpersonal Safety Domain Source: IP Abuse Screening     Physical abuse: Denies     Verbal abuse: Denies     Emotional abuse: Denies     Financial abuse: Denies     Sexual abuse: Denies   Utilities: Not on file       PHYSICAL EXAM   Physical Exam  Vitals and nursing note reviewed.   Constitutional:       General: She is not in acute distress.     Appearance: Normal appearance. She is not ill-appearing, toxic-appearing or diaphoretic.   HENT:      Head: Normocephalic and atraumatic.   Eyes:      General: No scleral icterus.     Conjunctiva/sclera: Conjunctivae normal.      Comments: No photophobia.   Cardiovascular:      Rate and Rhythm: Normal rate and regular rhythm.   Pulmonary:      Effort: Pulmonary effort is normal.    Abdominal:      Tenderness: There is no abdominal tenderness. There is no guarding or rebound.   Musculoskeletal:      Cervical back: Neck supple.   Skin:     General: Skin is warm and dry.      Comments: No diaphoresis   Neurological:      General: No focal deficit present.      Mental Status: She is alert.   Psychiatric:         Mood and Affect: Mood normal.         Behavior: Behavior normal.           SCREENINGS                   LAB, EKG AND DIAGNOSTIC RESULTS   Labs:  Recent Results (from the past 12 hour(s))   POC Pregnancy Urine Qual    Collection Time: 11/26/22  2:22 AM   Result Value Ref Range    Preg Test, Ur Negative Negative             ED COURSE and DIFFERENTIAL DIAGNOSIS/MDM   2:36 AM Differential and Considerations:     Differential diagnosis that were considered include:    Patient presents for request of pregnancy test for irregular menses.  Alert nontoxic-appearing.  Abdomen soft nontender.  Pregnancy negative.  Patient was discharged in stable condition with referral for GYN for irregular menstruation.      Records Reviewed (source and summary of external notes): Prior medical records and Nursing notes.    Vitals:    Vitals:    11/26/22 0215   BP: 113/62   Pulse: 81   Resp: 14   Temp: 98.3 F (36.8 C)   TempSrc: Oral   SpO2: 98%   Weight: 135.9 kg (299 lb 8 oz)   Height: 1.676 m (5\' 6" )        ED COURSE           Patient was given the following medications:  Medications -  No data to display    CONSULTS: See ED Course/MDM for further details.            PROCEDURES   Unless otherwise noted above, none  Procedures      CRITICAL CARE TIME       ED IMPRESSION     1. Irregular menses          DISPOSITION/PLAN   DISPOSITION Decision To Discharge 11/26/2022 02:34:14 AM    Discharge Note: The patient is stable for discharge home. The signs, symptoms, diagnosis, and discharge instructions have been discussed, understanding conveyed, and agreed upon. The patient is to follow up as recommended or return to  ER should their symptoms worsen.      PATIENT REFERRED TO:  Randye Lobo, MD  [address]  [address]  [place] [address]  [phone]              DISCHARGE MEDICATIONS:     Medication List        ASK your doctor about these medications      buPROPion 150 MG extended release tablet  Commonly known as: Wellbutrin XL  Take 1 tablet by mouth every morning     predniSONE 20 MG tablet  Commonly known as: DELTASONE  Take 3 tablets once a day for 2 days, then take 2 tablets once a day for 2 days, then take 1 take once a day for 2 days     sertraline 100 MG tablet  Commonly known as: ZOLOFT  Take 1 tablet by mouth daily                DISCONTINUED MEDICATIONS:  Current Discharge Medication List          I am the Primary Clinician of Record. Trenesha Alcaide Theotis Barrio, MD (electronically signed)    (Please note that parts of this dictation were completed with voice recognition software. Quite often unanticipated grammatical, syntax, homophones, and other interpretive errors are inadvertently transcribed by the computer software. Please disregards these errors. Please excuse any errors that have escaped final proofreading.)     Rosalin Hawking, MD  11/26/22 0236

## 2022-12-09 ENCOUNTER — Ambulatory Visit: Payer: PRIVATE HEALTH INSURANCE | Primary: Student in an Organized Health Care Education/Training Program

## 2023-04-21 ENCOUNTER — Encounter: Admit: 2023-04-21 | Payer: PRIVATE HEALTH INSURANCE | Admitting: Acute Care | Primary: Acute Care

## 2023-04-21 ENCOUNTER — Encounter: Admit: 2023-04-21 | Admitting: Acute Care

## 2023-04-21 VITALS — BP 130/76 | HR 83 | Temp 98.20000°F | Resp 18 | Ht 66.0 in | Wt 315.8 lb

## 2023-04-21 DIAGNOSIS — R899 Unspecified abnormal finding in specimens from other organs, systems and tissues: Secondary | ICD-10-CM

## 2023-04-21 DIAGNOSIS — Z113 Encounter for screening for infections with a predominantly sexual mode of transmission: Secondary | ICD-10-CM

## 2023-04-21 MED ORDER — SERTRALINE HCL 50 MG PO TABS
50 | ORAL_TABLET | Freq: Every day | ORAL | 3 refills | Status: AC
Start: 2023-04-21 — End: 2023-08-19

## 2023-04-21 NOTE — Progress Notes (Signed)
 Subjective  Chief Complaint   Patient presents with    Establish Care    Discuss Medications    Annual Exam     HPI:  Susan Ruiz is a 22 y.o. female presents with limited past medical history including fatigue, anxiety and depression with diffic

## 2023-04-21 NOTE — Progress Notes (Signed)
 Chief Complaint   Patient presents with    Establish Care    Discuss Medications    Annual Exam           BP 130/76 (Site: Right Upper Arm, Position: Sitting)   Pulse 83   Temp 98.2 F (36.8 C) (Oral)   Resp 18   Ht 1.676 m (5\' 6" )   Wt (!) 143.2 kg (315

## 2023-04-22 LAB — CBC WITH AUTO DIFFERENTIAL
Basophils %: 0 %
Basophils Absolute: 0 10*3/uL (ref 0.0–0.2)
Eosinophils %: 1 %
Eosinophils Absolute: 0 10*3/uL (ref 0.0–0.4)
Hematocrit: 39.8 % (ref 34.0–46.6)
Hemoglobin: 12.2 g/dL (ref 11.1–15.9)
Immature Grans (Abs): 0 10*3/uL (ref 0.0–0.1)
Immature Granulocytes %: 0 %
Lymphocytes %: 42 %
Lymphocytes Absolute: 1.9 10*3/uL (ref 0.7–3.1)
MCH: 27.4 pg (ref 26.6–33.0)
MCHC: 30.7 g/dL — ABNORMAL LOW (ref 31.5–35.7)
MCV: 89 fL (ref 79–97)
Monocytes %: 6 %
Monocytes Absolute: 0.3 10*3/uL (ref 0.1–0.9)
Neutrophils %: 51 %
Neutrophils Absolute: 2.3 10*3/uL (ref 1.4–7.0)
Platelets: 276 10*3/uL (ref 150–450)
RBC: 4.46 x10E6/uL (ref 3.77–5.28)
RDW: 12.7 % (ref 11.7–15.4)
WBC: 4.5 10*3/uL (ref 3.4–10.8)

## 2023-04-22 LAB — LIPID PANEL
Cholesterol, Total: 188 mg/dL (ref 100–199)
HDL: 42 mg/dL (ref >39)
LDL Cholesterol: 132 mg/dL — ABNORMAL HIGH (ref 0–99)
Triglycerides: 76 mg/dL (ref 0–149)
VLDL Cholesterol Calculated: 14 mg/dL (ref 5–40)

## 2023-04-22 LAB — COMPREHENSIVE METABOLIC PANEL
ALT: 12 [IU]/L (ref 0–32)
AST: 14 [IU]/L (ref 0–40)
Albumin: 3.9 g/dL — ABNORMAL LOW (ref 4.0–5.0)
Alkaline Phosphatase: 71 [IU]/L (ref 44–121)
BUN/Creatinine Ratio: 13 (ref 9–23)
BUN: 9 mg/dL (ref 6–20)
CO2: 26 mmol/L (ref 20–29)
Calcium: 9.2 mg/dL (ref 8.7–10.2)
Chloride: 106 mmol/L (ref 96–106)
Creatinine: 0.69 mg/dL (ref 0.57–1.00)
Est, Glom Filt Rate: 125 mL/min/{1.73_m2} (ref >59)
Globulin, Total: 2.6 g/dL (ref 1.5–4.5)
Glucose: 90 mg/dL (ref 70–99)
Potassium: 4.5 mmol/L (ref 3.5–5.2)
Sodium: 141 mmol/L (ref 134–144)
Total Bilirubin: 0.4 mg/dL (ref 0.0–1.2)
Total Protein: 6.5 g/dL (ref 6.0–8.5)

## 2023-04-22 LAB — VITAMIN D 25 HYDROXY: Vit D, 25-Hydroxy: 9.8 ng/mL — ABNORMAL LOW (ref 30.0–100.0)

## 2023-04-22 LAB — VITAMIN B12: Vitamin B-12: 445 pg/mL (ref 232–1245)

## 2023-04-22 LAB — THYROID CASCADE PROFILE: TSH: 1.04 u[IU]/mL (ref 0.450–4.500)

## 2023-04-22 LAB — HEPATITIS C ANTIBODY: Hepatitis C Ab: NONREACTIVE

## 2023-04-23 LAB — C.TRACHOMATIS N.GONORRHOEAE DNA
Chlamydia trachomatis, NAA: NEGATIVE
Neisseria Gonorrhoeae, NAA: NEGATIVE

## 2023-04-25 NOTE — Telephone Encounter (Signed)
 Patient called and stated that she is having an allergic reaction to a medication and would like a nurse to call her as soon as possible.       Call back [phone]

## 2023-04-25 NOTE — Telephone Encounter (Signed)
 Per patient is having diarrhea, wants to know if you have any suggestions for this. Feels it's from the Sertraline. It's helping so she doesn't want to d/c medication.     Please advise.

## 2023-05-09 NOTE — Telephone Encounter (Signed)
 Left detailed message advising patient

## 2023-05-19 NOTE — Telephone Encounter (Signed)
 Called patient who was referred for bariatric surgery. Patient stated she is not interested in surgery but would like to discuss non-surgical options. Patient has been sent the link for our pre-recorded [facility] Medical Weight Loss Orientation. Patient is required to register, view the recording, call insurance to verify benefits, then send an email to the program coordinator to schedule a consultation.

## 2023-06-07 ENCOUNTER — Encounter: Payer: PRIVATE HEALTH INSURANCE | Attending: Obstetrics & Gynecology | Primary: Acute Care

## 2023-06-28 ENCOUNTER — Emergency Department: Admit: 2023-06-29 | Payer: MEDICAID | Primary: Acute Care

## 2023-06-28 DIAGNOSIS — S93602A Unspecified sprain of left foot, initial encounter: Secondary | ICD-10-CM

## 2023-06-28 NOTE — ED Notes (Signed)
Ace wrap and post op shoe applied by [employer]

## 2023-06-28 NOTE — Discharge Instructions (Addendum)
Thank you for choosing our Emergency Department for your care.  It is our privilege to care for you in your time of need.  In the next several days, you may receive a survey via email or mailed to your home about your experience with our team.  We would greatly appreciate you taking a few minutes to complete the survey, as we use this information to learn what we have done well and what we could be doing better. Thank you for trusting [place] with your care!    Below you will find a list of your tests from today's visit.   Labs and Radiology Studies  No results found for this or any previous visit (from the past 12 hour(s)).  XR FOOT LEFT (MIN 3 VIEWS)    Result Date: 06/28/2023  EXAM: XR FOOT LEFT (MIN 3 VIEWS) INDICATION: Left ankle and foot pain, unsure injury. . COMPARISON: None. FINDINGS: Three views of the left foot demonstrate no fracture or other acute osseous or articular abnormality. The soft tissues are within normal limits.     No acute abnormality. Electronically signed by Carola Rhine    ------------------------------------------------------------------------------------------------------------  The evaluation and treatment you received in the Emergency Department were for an urgent problem. It is important that you follow-up with a doctor, nurse practitioner, or physician assistant to:  (1) confirm your diagnosis,  (2) re-evaluation of changes in your illness and treatment, and (3) for ongoing care. Please take your discharge instructions with you when you go to your follow-up appointment.     If you have any problem arranging a follow-up appointment, contact us!  If your symptoms become worse or you do not improve as expected, please return to [place]. We are available 24 hours a day.     If a prescription has been provided, please fill it as soon as possible to prevent a delay in treatment. If you have any questions or reservations about taking the medication due to side effects or interactions with other  medications, please call your primary care provider or contact us directly.  Again, THANK YOU for choosing [place] to care for YOU!

## 2023-06-28 NOTE — ED Triage Notes (Signed)
Reports pain to lateral left ankle that radiates to the center of her foot.  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

## 2023-06-28 NOTE — ED Provider Notes (Signed)
[facility] EMERGENCY DEPT  EMERGENCY DEPARTMENT HISTORY AND PHYSICAL EXAM      Date: 06/28/2023  Patient Name: Susan Ruiz  MRN: [id number]  Birthdate: 21-Aug-2000  Date of evaluation: 06/28/2023  Provider: Marquette Old, MD   Note Started: 9:48 PM EST 06/28/23    HISTORY OF PRESENT ILLNESS     Chief Complaint   Patient presents with    Foot Pain       History Provided By: Patient    HPI: Susan Ruiz is a 23 y.o. female presents with two days of left lateral ankle and foot pain. She believes she twisted her ankle. IN turn, she is working a new job and is on her feet a lot which she hasn't been of late. Pt also notes some swelling. Pin is worsened with weight bearing.    PAST MEDICAL HISTORY   Past Medical History:  No past medical history on file.    Past Surgical History:  No past surgical history on file.    Family History:  No family history on file.    Social History:  Social History     Tobacco Use    Smoking status: Every Day     Current packs/day: 0.25     Types: Cigarettes    Smokeless tobacco: Never   Vaping Use    Vaping status: Never Used   Substance Use Topics    Alcohol use: Never    Drug use: Never       Allergies:  No Known Allergies    PCP: Ples Specter, APRN - NP    Current Meds:   No current facility-administered medications for this encounter.     Current Outpatient Medications   Medication Sig Dispense Refill    sertraline (ZOLOFT) 50 MG tablet Take 1 tablet by mouth daily 30 tablet 3    buPROPion (WELLBUTRIN XL) 150 MG extended release tablet Take 1 tablet by mouth every morning (Patient not taking: Reported on 04/21/2023) 90 tablet 1    predniSONE (DELTASONE) 20 MG tablet Take 3 tablets once a day for 2 days, then take 2 tablets once a day for 2 days, then take 1 take once a day for 2 days (Patient not taking: Reported on 09/22/2022) 12 tablet 1       Social Determinants of Health:   Social Determinants of Health     Tobacco Use: High Risk (05/26/2023)    Patient History     Smoking Tobacco Use:  Every Day     Smokeless Tobacco Use: Never     Passive Exposure: Not on file   Alcohol Use: Not At Risk (08/27/2022)    AUDIT-C     Frequency of Alcohol Consumption: Never     Average Number of Drinks: Patient does not drink     Frequency of Binge Drinking: Never   Financial Resource Strain: Medium Risk (09/22/2022)    Overall Financial Resource Strain (CARDIA)     Difficulty of Paying Living Expenses: Somewhat hard   Food Insecurity: Food Insecurity Present (09/22/2022)    Hunger Vital Sign     Worried About Running Out of Food in the Last Year: Never true     Ran Out of Food in the Last Year: Sometimes true   Transportation Needs: Unknown (09/22/2022)    PRAPARE - Therapist, art (Medical): Not on file     Lack of Transportation (Non-Medical): No   Physical Activity: Inactive (04/21/2023)    Exercise Vital  Sign     Days of Exercise per Week: 0 days     Minutes of Exercise per Session: 0 min   Stress: Not on file   Social Connections: Not on file   Intimate Partner Violence: Not on file   Depression: At risk (09/22/2022)    PHQ-2     PHQ-2 Score: 18   Housing Stability: Unknown (09/22/2022)    Housing Stability Vital Sign     Unable to Pay for Housing in the Last Year: Not on file     Number of Places Lived in the Last Year: Not on file     Unstable Housing in the Last Year: No   Interpersonal Safety: Not At Risk (12/05/2021)    Interpersonal Safety Domain Source: IP Abuse Screening     Physical abuse: Denies     Verbal abuse: Denies     Emotional abuse: Denies     Financial abuse: Denies     Sexual abuse: Denies   Utilities: Not on file       PHYSICAL EXAM   Physical Exam  Vitals and nursing note reviewed.   Constitutional:       General: She is not in acute distress.     Appearance: She is well-developed. She is not ill-appearing or toxic-appearing.   Cardiovascular:      Rate and Rhythm: Normal rate.   Pulmonary:      Effort: Pulmonary effort is normal.   Musculoskeletal:         General: Swelling  and tenderness present. Normal range of motion.      Comments: Ttp over lateral malleolus, over deltoid ligament and across mid-foot.  Mild swelling   Skin:     General: Skin is warm.   Neurological:      General: No focal deficit present.      Mental Status: She is alert.      Motor: No weakness.         SCREENINGS                  LAB, EKG AND DIAGNOSTIC RESULTS   Labs:  No results found for this or any previous visit (from the past 12 hour(s)).    EKG: Not Applicable    Radiologic Studies:  Non-plain film images such as CT, Ultrasound and MRI are read by the radiologist. Plain radiographic images are visualized and preliminarily interpreted by the ED Physician with the following findings: Not Applicable.    Interpretation per the Radiologist below, if available at the time of this note:  XR FOOT LEFT (MIN 3 VIEWS)   Final Result   No acute abnormality.      Electronically signed by Carola Rhine           ED COURSE and DIFFERENTIAL DIAGNOSIS/MDM   9:48 PM Differential and Considerations: concern for mid-foot sprain, ankle sprain however, will image to rule out foot fracture    Records Reviewed (source and summary of external notes): Prior medical records and Nursing notes    Vitals:    Vitals:    06/28/23 2136   BP: 125/87   Pulse: 83   Resp: 18   Temp: 98.1 F (36.7 C)   TempSrc: Oral   SpO2: 99%   Weight: (!) 139.3 kg (307 lb 3.2 oz)   Height: 1.676 m (5\' 6" )        ED COURSE       SEPSIS Reassessment: Patient does NOT meet Sepsis criteria after  ED workup    Clinical Management Tools:  Not Applicable    Patient was given the following medications:  Medications - No data to display    CONSULTS: See ED Course/MDM for further details.  None     Social Determinants affecting Diagnosis/Treatment: None    Smoking Cessation: Not Applicable    PROCEDURES   Unless otherwise noted above, none.  Procedures        Procedure Note - Ace Wrap Placement:  10:15 PM EST  Performed by: Marquette Old, MD   Neurovascularly intact  prior to treatment.  An Ace Wrap was placed on pt's left foot.  Joint was placed in neutral position.  Neurovascularly intact after tx.   The procedure took 1-15 minutes, and pt tolerated well.      Procedure Note - Splint/Brace/Sling/Immobilizer Placement:  10:20 PM EST  Performed by: Marquette Old, MD   Neurovascularly intact prior to application.  A pos-op splint was placed on pt's left foot.  The extremity was appropriately immobilized.  Neurovascularly intact after application.   The procedure took 1-15 minutes, and patient tolerated it well.                CRITICAL CARE TIME   Patient does not meet Critical Care Time, 0 minutes    ED IMPRESSION     1. Foot sprain, left, initial encounter          DISPOSITION/PLAN   DISPOSITION              Discharge Note: The patient is stable for discharge home. The signs, symptoms, diagnosis, and discharge instructions have been discussed, understanding conveyed, and agreed upon. The patient is to follow up as recommended or return to ER should their symptoms worsen.      PATIENT REFERRED TO:  Dow Adolph, MD  [address]  [place] [address]  [phone]    Schedule an appointment as soon as possible for a visit   As needed for ortho follow-up        DISCHARGE MEDICATIONS:     Medication List        START taking these medications      ibuprofen 800 MG tablet  Commonly known as: ADVIL;MOTRIN  Take 1 tablet by mouth every 8 hours as needed for Pain            ASK your doctor about these medications      buPROPion 150 MG extended release tablet  Commonly known as: Wellbutrin XL  Take 1 tablet by mouth every morning     predniSONE 20 MG tablet  Commonly known as: DELTASONE  Take 3 tablets once a day for 2 days, then take 2 tablets once a day for 2 days, then take 1 take once a day for 2 days     sertraline 50 MG tablet  Commonly known as: ZOLOFT  Take 1 tablet by mouth daily               Where to Get Your Medications        These medications were sent to  [facility] [address], [place] - [address] - P [phone] Carmon Ginsberg [phone]  [address], [place] [address]      Phone: [phone]   ibuprofen 800 MG tablet           DISCONTINUED MEDICATIONS:  Current Discharge Medication List          I am the  Primary Clinician of Record: Marquette Old, MD (electronically signed)    (Please note that parts of this dictation were completed with voice recognition software. Quite often unanticipated grammatical, syntax, homophones, and other interpretive errors are inadvertently transcribed by the computer software. Please disregards these errors. Please excuse any errors that have escaped final proofreading.)     Marquette Old, MD  07/03/23 1201

## 2023-06-29 ENCOUNTER — Inpatient Hospital Stay
Admit: 2023-06-29 | Discharge: 2023-06-29 | Disposition: A | Discharge status: Home / Self Care | Payer: MEDICAID | Attending: Emergency Medicine

## 2023-06-29 MED ORDER — IBUPROFEN 800 MG PO TABS
800 | ORAL_TABLET | Freq: Three times a day (TID) | ORAL | 0 refills | Status: AC | PRN
Start: 2023-06-29 — End: 2023-07-03

## 2024-03-15 ENCOUNTER — Ambulatory Visit: Admit: 2024-03-15 | Discharge: 2024-03-15 | Payer: Medicaid (Managed Care) | Primary: Acute Care

## 2024-03-15 VITALS — BP 117/79 | HR 70 | Temp 97.50000°F | Resp 17 | Wt 280.0 lb

## 2024-03-15 DIAGNOSIS — N898 Other specified noninflammatory disorders of vagina: Principal | ICD-10-CM

## 2024-03-15 NOTE — Progress Notes (Signed)
 "Patient Name: Susan Ruiz   Date of Birth:  08/19/2000   Patient Status: New patient,   Chief Complaint: Yeast Infection (C/o having a yeast infection and would like STD testing as well has some smelly discharge )        ____________________________________________________________________________________________       SUBJECTIVE/OBJECTIVE:  Patient presents with complaint of discharge for 2 weeks without itching.  No over-the-counter treatment.  Had an infection years ago.  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  He has no symptoms.      PAST MEDICAL HISTORY:   Medical: Pt  has no past medical history on file.  Surgical: Pt  has no past surgical history on file.  Family: Pt family history is not on file.  Social: Pt   Social History     Socioeconomic History    Marital status: Single     Spouse name: Not on file    Number of children: Not on file    Years of education: Not on file    Highest education level: Not on file   Occupational History    Not on file   Tobacco Use    Smoking status: Every Day     Current packs/day: 0.25     Types: Cigarettes     Passive exposure: Current    Smokeless tobacco: Never   Vaping Use    Vaping status: Never Used   Substance and Sexual Activity    Alcohol use: Never    Drug use: Never    Sexual activity: Yes     Partners: Male     Birth control/protection: Injection   Other Topics Concern    Not on file   Social History Narrative    Not on file     Social Drivers of Health     Financial Resource Strain: Medium Risk (09/22/2022)    Overall Financial Resource Strain (CARDIA)     Difficulty of Paying Living Expenses: Somewhat hard   Food Insecurity: Food Insecurity Present (09/22/2022)    Hunger Vital Sign     Worried About Running Out of Food in the Last Year: Never true     Ran Out of Food in the Last Year: Sometimes true   Transportation Needs: Unknown (09/22/2022)    PRAPARE - Therapist, Art  (Medical): Not on file     Lack of Transportation (Non-Medical): No   Physical Activity: Inactive (04/21/2023)    Exercise Vital Sign     Days of Exercise per Week: 0 days     Minutes of Exercise per Session: 0 min   Stress: Not on file   Social Connections: Not on file   Intimate Partner Violence: Not on file   Housing Stability: Unknown (09/22/2022)    Housing Stability Vital Sign     Unable to Pay for Housing in the Last Year: Not on file     Number of Places Lived in the Last Year: Not on file     Unstable Housing in the Last Year: No       Allergies: PatientNo Known Allergies      OBJECTIVE:   Blood pressure 117/79, pulse 70, temperature 97.5 F (36.4 C), temperature source Oral, resp. rate 17, weight 127 kg (280 lb), SpO2 99%.    All Vitals reviewed by myself    Physical Exam  deferred    Procedures:  Procedures      DIAGNOSTICS:     All lab values have been personally reviewed by myself:   No results found for this visit on 03/15/24.     The results of the diagnostic studies have been independently interpreted by myself: All studies will be over-read by Radiologist and patient will be called if any changes to treatment or diagnosis based on final read.       ASSESSMENT/PLAN:    Clinical Impression:   Assessment & Plan  Vaginal discharge            SA  We have ordered testing for various STIs.  Those results should show up in MyChart.  Will treat if appropriate.    An electronic signature was used to authenticate this note.    Glendia Church, MD         "

## 2024-03-15 NOTE — Patient Instructions (Signed)
"  We have ordered testing for various STIs.  Those results should show up in MyChart.  Will treat if appropriate.  "

## 2024-03-19 LAB — NUSWAB VAGINITIS PLUS (VG+) WITH CANDIDA (SIX SPECIES)
Candida Lusitaniae, NAA: NEGATIVE
Candida Parapsilosis/Tropicalis: NEGATIVE
Candida albicans, NAA: NEGATIVE
Candida glabrata: NEGATIVE
Candida krusei: NEGATIVE
Chlamydia trachomatis, NAA: NEGATIVE
Neisseria Gonorrhoeae, NAA: NEGATIVE
Trichomonas Vaginalis by NAA: NEGATIVE

## 2024-03-20 ENCOUNTER — Encounter

## 2024-03-20 MED ORDER — METRONIDAZOLE 500 MG PO TABS
500 | ORAL_TABLET | Freq: Two times a day (BID) | ORAL | 0 refills | Status: AC
Start: 2024-03-20 — End: 2024-03-27

## 2024-04-16 ENCOUNTER — Ambulatory Visit: Admit: 2024-04-16 | Discharge: 2024-04-16 | Payer: Medicaid (Managed Care) | Primary: Acute Care

## 2024-04-16 VITALS — BP 111/72 | HR 76 | Temp 98.30000°F | Resp 19 | Ht 66.0 in | Wt 272.0 lb

## 2024-04-16 DIAGNOSIS — Z0189 Encounter for other specified special examinations: Principal | ICD-10-CM

## 2024-04-16 LAB — AMB POC URINE PREGNANCY TEST, VISUAL COLOR COMPARISON: HCG, Pregnancy, Urine, POC: NEGATIVE

## 2024-04-16 MED ORDER — ONDANSETRON 4 MG PO TBDP
4 | ORAL_TABLET | Freq: Three times a day (TID) | ORAL | 0 refills | 7.00000 days | Status: AC | PRN
Start: 2024-04-16 — End: ?

## 2024-04-16 MED ORDER — CLOTRIMAZOLE 1 % EX CREA
1 | CUTANEOUS | 1 refills | 15.00000 days | Status: AC
Start: 2024-04-16 — End: 2024-04-23

## 2024-04-16 MED ORDER — NAPROXEN 500 MG PO TABS
500 | ORAL_TABLET | Freq: Two times a day (BID) | ORAL | 0 refills | 25.00000 days | Status: AC
Start: 2024-04-16 — End: ?

## 2024-04-16 NOTE — Progress Notes (Signed)
 "Patient Name: Susan Ruiz   Date of Birth:  05/16/2001   Patient Status: New patient,   Chief Complaint: Having really bad migraines in the back of her head, nauseas        ____________________________________________________________________________________________       SUBJECTIVE/OBJECTIVE:  Patient presents with complaint of new onset of right posterior head intermittent headaches for 2 weeks.  Has no history of migraines.  She also has had some nausea and 2 episodes of vomiting once last week and once this morning.  Feels a bit nauseous as well says her headaches are worse if she moves her head.  If she rests and then stands up she feels the headache.  No tingling numbness weakness.  No visual changes.  No family history of migraines.  Has been taking Tylenol  and Motrin  without much improvement.  She did start metronidazole  which she finally got filled from the pharmacy.  It was given on 11 5 but she started it last week for BV, but this was after the headaches had started.  She also has been on sertraline  for anxiety.  She got it refilled on 04/02/2024 at 50 mg dose, she had previously been on the 100 mg and found that it gave her a little bit of nausea.  She restarted it because of some anxiety.  Has no primary care physician      PAST MEDICAL HISTORY:   Medical: Pt  has no past medical history on file.  Surgical: Pt  has no past surgical history on file.  Family: Pt family history is not on file.  Social: Pt   Social History     Socioeconomic History    Marital status: Single     Spouse name: Not on file    Number of children: Not on file    Years of education: Not on file    Highest education level: Not on file   Occupational History    Not on file   Tobacco Use    Smoking status: Every Day     Current packs/day: 0.25     Types: Cigarettes     Passive exposure: Current    Smokeless tobacco: Never   Vaping Use    Vaping status: Never Used   Substance and Sexual Activity    Alcohol use: Never    Drug use:  Never    Sexual activity: Yes     Partners: Male     Birth control/protection: Injection   Other Topics Concern    Not on file   Social History Narrative    Not on file     Social Drivers of Health     Financial Resource Strain: Medium Risk (09/22/2022)    Overall Financial Resource Strain (CARDIA)     Difficulty of Paying Living Expenses: Somewhat hard   Food Insecurity: Food Insecurity Present (09/22/2022)    Hunger Vital Sign     Worried About Running Out of Food in the Last Year: Never true     Ran Out of Food in the Last Year: Sometimes true   Transportation Needs: Unknown (09/22/2022)    PRAPARE - Therapist, Art (Medical): Not on file     Lack of Transportation (Non-Medical): No   Physical Activity: Inactive (04/21/2023)    Exercise Vital Sign     Days of Exercise per Week: 0 days     Minutes of Exercise per Session: 0 min   Stress: Not on file  Social Connections: Not on file   Intimate Partner Violence: Not on file   Housing Stability: Unknown (09/22/2022)    Housing Stability Vital Sign     Unable to Pay for Housing in the Last Year: Not on file     Number of Places Lived in the Last Year: Not on file     Unstable Housing in the Last Year: No       Allergies: PatientNo Known Allergies      OBJECTIVE:   Blood pressure 111/72, pulse 76, temperature 98.3 F (36.8 C), temperature source Temporal, resp. rate 19, height 1.676 m (5' 6), weight 123.4 kg (272 lb), SpO2 97%.    All Vitals reviewed by myself    Physical Exam -awake, alert, oriented x 3, in no acute distress.  Pupil equal is round reactive to light, extraocular movements intact, questionable exophthalmos.  No thyromegaly.  Chest clear to auscultation bilaterally, normal S1-S2 without murmur rub or gallop.  Negative Romberg.  Normal gait.  Normal rapid alternating movements.  Normal heel-to-toe.  Normal handgrip.  No sensory loss.  No reproducible tenderness in the right posterior head.      Procedures:    Procedures      DIAGNOSTICS:     All lab values have been personally reviewed by myself:   No results found for this visit on 04/16/24.     The results of the diagnostic studies have been independently interpreted by myself: All studies will be over-read by Radiologist and patient will be called if any changes to treatment or diagnosis based on final read.       ASSESSMENT/PLAN:    Clinical Impression:   Assessment & Plan  Patient requested diagnostic testing       Orders:    AMB POC URINE PREGNANCY TEST, VISUAL COLOR COMPARISON    Acute nonintractable headache, unspecified headache type       Orders:    ondansetron  (ZOFRAN -ODT) 4 MG disintegrating tablet; Take 1 tablet by mouth 3 times daily as needed for Nausea or Vomiting    naproxen  (NAPROSYN ) 500 MG tablet; Take 1 tablet by mouth 2 times daily (with meals)    SA    It is unclear as to the origin of your headache.  I am a bit concerned because of the nausea and vomiting associated with the headache.  I have given you treatment for the nausea and vomiting as well as for pain.  You need to develop a relationship with a primary care doctor as soon as possible and I gave you a list of them.  If this persist you definitely need an imaging test of your head to rule out any mass.  If you are unable to get in primary care soon, you will need to go to the emergency room to get an imaging test, not to urgent care.  An electronic signature was used to authenticate this note.    [facility]         "

## 2024-04-16 NOTE — Patient Instructions (Addendum)
"  It is unclear as to the origin of your headache.  I am a bit concerned because of the nausea and vomiting associated with the headache.  I have given you treatment for the nausea and vomiting as well as for pain.  You need to develop a relationship with a primary care doctor as soon as possible and I gave you a list of them.  If this persist you definitely need an imaging test of your head to rule out any mass.  If you are unable to get in primary care soon, you will need to go to the emergency room to get an imaging test, not to urgent care.    "
